# Patient Record
Sex: Male | Born: 2000 | Race: White | Hispanic: No | Marital: Single | Smoking: Never smoker
Health system: Southern US, Community
[De-identification: ages and names within clinical notes are randomized; demographics above are authoritative.]

---

## 2021-09-18 ENCOUNTER — Other Ambulatory Visit: Payer: Self-pay | Admitting: Sports Medicine

## 2021-09-18 DIAGNOSIS — R52 Pain, unspecified: Secondary | ICD-10-CM

## 2021-09-28 ENCOUNTER — Other Ambulatory Visit: Payer: Self-pay

## 2021-09-29 ENCOUNTER — Other Ambulatory Visit: Payer: Self-pay

## 2021-09-29 ENCOUNTER — Ambulatory Visit
Admission: RE | Admit: 2021-09-29 | Discharge: 2021-09-29 | Disposition: A | Discharge status: Home / Self Care | Payer: BC Managed Care – PPO | Source: Ambulatory Visit | Attending: Sports Medicine | Admitting: Sports Medicine

## 2021-09-29 DIAGNOSIS — R52 Pain, unspecified: Secondary | ICD-10-CM

## 2021-09-29 MED ORDER — METHYLPREDNISOLONE ACETATE 40 MG/ML INJ SUSP (RADIOLOG
40.0000 mg | Freq: Once | INTRAMUSCULAR | Status: AC
  Administered 2021-09-29: 40 mg via INTRA_ARTICULAR

## 2021-09-29 MED ORDER — IOPAMIDOL (ISOVUE-M 200) INJECTION 41%
1.0000 mL | Freq: Once | INTRAMUSCULAR | Status: AC
  Administered 2021-09-29: 1 mL via INTRA_ARTICULAR

## 2021-10-16 NOTE — Therapy (Signed)
?OUTPATIENT PHYSICAL THERAPY LOWER EXTREMITY EVALUATION ? ? ?Patient Name: Derrick Ellis ?MRN: [id number] ?DOB:[birth year], 21 y.o., male ?Today's Date: 10/17/2021 ? ? PT End of Session - 10/17/21 1340   ? ? Visit Number 1   ? Date for PT Re-Evaluation 11/14/21   ? Authorization Type BCBS   ? Authorization - Visit Number 1   ? Authorization - Number of Visits 30   ? PT Start Time 1102   ? PT Stop Time 1147   ? PT Time Calculation (min) 45 min   ? Activity Tolerance Patient tolerated treatment well   ? Behavior During Therapy [facility] for tasks assessed/performed   ? ?  ?  ? ?  ? ? ?PMH/PSH: 2020 Rt ACL tear with patellar tendon graft reconstuction ? ? ?PCP: Cristine Polio, MD ? ?REFERRING PROVIDER: Berle Mull, MD ? 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 ?THERAPY DIAG:  ?Cramp and spasm ? ?Abnormal posture ? ?ONSET DATE: fall football season 2022 ? ?SUBJECTIVE:  ? ?SUBJECTIVE STATEMENT: ?Pt with ongoing pelvic pain since football season fall 2022.  Pain was significant during the season and was more in the Rt proximal groin.  Pt is a Programmer, applications for [employer].  He is currently in "off season" practicing 3x/week, weight lifting 3x weekly.  This ends in 1 week.  Pt is fully participating but with pain.  He had pubic symphysis injection 09/29/21 which helped for about 1 week but pain is returning as of this week.  Current pain is Rt sided in posterior groin. ? ?PERTINENT HISTORY: ?Pubic symphysis injection under fluoroscopy 09/29/21 ?Rt ACL reconstruction 04/02/2019, BPTB Autograft  ? ?PAIN:  ?PAIN:  ?Are you having pain? Yes ?NPRS scale: 0-7/10 ?Pain location: Rt posterior groin ?Pain orientation: Right, Posterior, and Proximal  ?PAIN TYPE: sharp ?Pain description: sharp  ?Aggravating factors: when hips are uneven, certain positions, hard to describe ?Relieving factors: change of position ? ? ?PRECAUTIONS: None ? ?WEIGHT BEARING RESTRICTIONS No ? ?FALLS:  ?Has patient fallen in last  6 months? No ? ?LIVING ENVIRONMENT: ?Lives with: dorm room, single ?Lives in: Other dorm room at college ?Stairs: sometimes pain with stairs ?Has following equipment at home: None ? ?OCCUPATION: student, full time ? ?PLOF: Independent ? ?PATIENT GOALS  ?Get rid of pain ? ? ?OBJECTIVE:  ? ?DIAGNOSTIC FINDINGS:  ?Pelvic MRI 09/11/21 ?Pubic symphysis injection under flouroscopy 09/29/21 ? ?PATIENT SURVEYS:  ?FOTO 78%, goal 89% ? ?COGNITION: ? Overall cognitive status: Within functional limits for tasks assessed   ?  ?SENSATION: ?WFL ? ?MUSCLE LENGTH: ?End range hamstrings limited ?Rt > Lt piriformis and gluteals limited ? ?POSTURE:  ?scoliosis ? ?PALPATION: ?Signif local tender to palpation inside Rt ischial tuberosity, pelvic floor ?TP present in Rt glut min, glut med, piriformis ? ?LE ROM: ? ?Active ROM Right ?10/17/2021 Left ?10/17/2021  ?Hip flexion    ?Hip extension    ?Hip abduction    ?Hip adduction    ?Hip internal rotation    ?Hip external rotation 50 55  ?Knee flexion    ?Knee extension    ?Ankle dorsiflexion    ?Ankle plantarflexion    ?Ankle inversion    ?Ankle eversion    ? (Blank rows = not tested) ? ?LE MMT: ? ?5/5 with with recreation of sharp pelvic pain on testing Rt hip abduction ? ?LOWER EXTREMITY SPECIAL TESTS:  ?Hip special tests: Saralyn Pilar (FABER) test: negative ?SI joints unremarkable ? ?FUNCTIONAL TESTS:  ?External palpation of pelvic  floor lift and bulge: min lift, lacks bulge .  Pt denies difficulty with defecation or pain with this. ?Active SLR: lacks pelvic control with Lt LE lift ? ? ?GAIT: ?WNL ? ? ? ?TODAY'S TREATMENT: ?Pt education on pelvic floor anatomy using model, role of pelvic floor ?Discussion of findings from evaluation and possible treatment avenues (Pelvic floor internal release and retraining, DN to external hips, rebalance pelvic strength ?Therex: quadrupred rocking, diaphragmatic breathing, pigeon pose edge of mat table Rt LE ? ? ?PATIENT EDUCATION:  ?Education details: Access  Code: [email or web address] ?Person educated: Patient and Proofreader from college ?Education method: Explanation, Demonstration, and Handouts ?Education comprehension: verbalized understanding and returned demonstration ? ? ?HOME EXERCISE PROGRAM: ?Access Code: [email or web address] ?URL: [email or web address] ?Date: 10/17/2021 ?Prepared by: Venetia Night Dorse Locy ? ?Exercises ?- Pigeon Pose  - 1 x daily - 7 x weekly - 1 sets - 3 reps - 30 hold ?- Quadruped Rocking Backward  - 1 x daily - 7 x weekly - 1 sets - 15 reps ? ?ASSESSMENT: ? ?CLINICAL IMPRESSION: ?Patient is a 21 y.o. male who was seen today for physical therapy evaluation and treatment for pelvic pain.  He is a Psychologist, educational for [employer].  His athletic trainer from college attended session with him. He has history of a significant Rt proximal groin injury during fall 2022 football season.  This has since resolved but he now has posterior Rt sided pelvic pain.  He has significant local tenderness to palpation along inside of Rt ischial tuberosity, consistent with external palpation along pelvic floor muscles.  External palpation reveals fair PF lift and poor bulge.  He reports pain is sharp with certain movements or positions and he can often reposition to eliminate the pain.  He has 5/5 strength in bil LE although pain was reproduced with Rt hip abd strength testing.  SI joints are unremarkable.  Active SLR is + on Lt for poor pelvic control.  Hip ROM is limited bil ER and LE flexibility is limited end range for bil hamstrings, piriformis and gluteals.  Pt will benefit from further skilled assessment and treatment by a pelvic PT to address suspected dysfunction in Rt pelvic floor musculature and to rebalance stabilization of pelvis. ? ? ?OBJECTIVE IMPAIRMENTS decreased ROM, increased muscle spasms, impaired flexibility, impaired tone, and pain.  ? ?ACTIVITY LIMITATIONS  sport .  ? ?PERSONAL FACTORS Time since onset of injury/illness/exacerbation are  also affecting patient's functional outcome.  ? ? ?REHAB POTENTIAL: Excellent ? ?CLINICAL DECISION MAKING: Stable/uncomplicated ? ?EVALUATION COMPLEXITY: Low ? ? ?GOALS: ?Goals reviewed with patient? Yes ? ?SHORT TERM GOALS: Target date: 10/31/2021 ? ?Pt will be able to understand source of pelvic pain and learn tools to address dysfunction. ?Baseline: ?Goal status: INITIAL ? ?2.   ?Baseline:  ?Goal status:  ? ? ? ?LONG TERM GOALS: Target date: 11/14/2021 ? ?Pt will report improved pelvic pain by at least 50% with exercise. ?Baseline:  ?Goal status: INITIAL ? ?2.  Pt will be ind with HEP and understand how to safely progress. ?Baseline:  ?Goal status: INITIAL ? ?3.  Improve FOTO score to at least 89% to demo improved function. ?Baseline: 78% ?Goal status: INITIAL ? ?4. ? ? ?PLAN: ?PT FREQUENCY: 2x/week ? ?PT DURATION: 4 weeks ? ?PLANNED INTERVENTIONS: Therapeutic exercises, Therapeutic activity, Neuromuscular re-education, Balance training, Patient/Family education, Joint mobilization, Dry Needling, Electrical stimulation, Spinal mobilization, Cryotherapy, Moist heat, Biofeedback, and Manual therapy ? ?PLAN FOR NEXT SESSION: with pelvic PT: assess and treat Rt pelvic  floor pain if Pt gives consent, add new goals if appropriate ? ? ?Baruch Merl, PT ?10/17/21 1:42 PM ? ?

## 2021-10-17 ENCOUNTER — Encounter: Payer: Self-pay | Admitting: Physical Therapy

## 2021-10-17 ENCOUNTER — Ambulatory Visit: Payer: BC Managed Care – PPO | Attending: Sports Medicine | Admitting: Physical Therapy

## 2021-10-17 DIAGNOSIS — R293 Abnormal posture: Secondary | ICD-10-CM | POA: Diagnosis not present

## 2021-10-17 DIAGNOSIS — R252 Cramp and spasm: Secondary | ICD-10-CM | POA: Diagnosis present

## 2021-10-24 ENCOUNTER — Encounter: Payer: PRIVATE HEALTH INSURANCE | Admitting: Physical Therapy

## 2021-10-24 ENCOUNTER — Ambulatory Visit: Payer: BC Managed Care – PPO

## 2021-10-24 DIAGNOSIS — R293 Abnormal posture: Secondary | ICD-10-CM

## 2021-10-24 DIAGNOSIS — R252 Cramp and spasm: Secondary | ICD-10-CM | POA: Diagnosis not present

## 2021-10-24 NOTE — Therapy (Signed)
?OUTPATIENT PHYSICAL THERAPY TREATMENT NOTE ? ? ?Patient Name: Derrick Ellis ?MRN: [id number] ?DOB:2000-08-30, 21 y.o., male ?Today's Date: 10/24/2021 ? ?PCP: Marcelle Smiling, MD ?REFERRING PROVIDER: Marcelle Smiling, MD ? ?END OF SESSION:  ? PT End of Session - 10/24/21 0935   ? ? Visit Number 2   ? Date for PT Re-Evaluation 11/14/21   ? Authorization Type BCBS   ? Authorization - Visit Number 2   ? Authorization - Number of Visits 30   ? PT Start Time 0935   ? PT Stop Time 1015   ? PT Time Calculation (min) 40 min   ? Activity Tolerance Patient tolerated treatment well   ? Behavior During Therapy [facility] for tasks assessed/performed   ? ?  ?  ? ?  ? ? ?History reviewed. No pertinent past medical history. ?History reviewed. No pertinent surgical history. ?There are no problems to display for this patient. ? ? 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 ?THERAPY DIAG:  ?Cramp and spasm ? ?Abnormal posture ? ?PERTINENT HISTORY: Rt ACL repair; Rt groin strain ? ?PRECAUTIONS: NA ? ?SUBJECTIVE: Pt states that pain was not aggravated after last treatment session. He is able to practice, but he is very sore. He also reports previous groin injury that was separate from this new pain that started in September. He denies any bowel, urinary, sexual dysfunction. He was having Rt sided abdominal pain with sit-ups, but this is no longer happening. He feels like pain is worse when hips are uneven.  ? ?PAIN:  ?Are you having pain? Yes, 4/10, moderate, but can be sharp ? ?  ?SUBJECTIVE:  ?  ?SUBJECTIVE STATEMENT: ?Pt with ongoing pelvic pain since football season fall 2022.  Pain was significant during the season and was more in the Rt proximal groin.  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  This ends in 1 week.  Pt is fully participating but with pain.  He had pubic symphysis injection 09/29/21 which helped for about 1 week but pain is  returning as of this week.  Current pain is Rt sided in posterior groin. ?  ?PERTINENT HISTORY: ?Pubic symphysis injection under fluoroscopy 09/29/21 ?Rt ACL reconstruction 04/02/2019, BPTB Autograft  ?  ?PAIN:  ?PAIN:  ?Are you having pain? Yes ?NPRS scale: 0-7/10 ?Pain location: Rt posterior groin ?Pain orientation: Right, Posterior, and Proximal  ?PAIN TYPE: sharp ?Pain description: sharp  ?Aggravating factors: when hips are uneven, certain positions, hard to describe ?Relieving factors: change of position ?  ?  ?PRECAUTIONS: None ?  ?WEIGHT BEARING RESTRICTIONS No ?  ?FALLS:  ?Has patient fallen in last 6 months? No ?  ?LIVING ENVIRONMENT: ?Lives with: dorm room, single ?Lives in: Other dorm room at college ?Stairs: sometimes pain with stairs ?Has following equipment at home: None ?  ?OCCUPATION: student, full time ?  ?PLOF: Independent ?  ?PATIENT GOALS  ?Get rid of pain ?  ?  ?OBJECTIVE: Reduced and painful IR on Rt hip, no pain with resisted Rt adduction, tenderness externally to Rt OI; decreased stability with SL squat on Rt and Rt weight shift/Lt rotation with regular squat; significant tightness and tenderness throughout Rt glutes, hip flexors, and adductors; internal rectal exam demonstrated exquisite pain in Rt puborectalis and obturator internus ?  ?Objective 10/17/21: ? ?DIAGNOSTIC FINDINGS:  ?Pelvic MRI 09/11/21 ?Pubic symphysis injection under flouroscopy 09/29/21 ?  ?PATIENT SURVEYS:  ?FOTO 78%,  goal 89% ?  ?COGNITION: ?          Overall cognitive status: Within functional limits for tasks assessed               ?           ?SENSATION: ?WFL ?  ?MUSCLE LENGTH: ?End range hamstrings limited ?Rt > Lt piriformis and gluteals limited ?  ?POSTURE:  ?scoliosis ?  ?PALPATION: ?Signif local tender to palpation inside Rt ischial tuberosity, pelvic floor ?TP present in Rt glut min, glut med, piriformis ?  ?LE ROM: ?  ?Active ROM Right ?10/17/2021 Left ?10/17/2021  ?Hip flexion      ?Hip extension      ?Hip abduction       ?Hip adduction      ?Hip internal rotation      ?Hip external rotation 50 55  ?Knee flexion      ?Knee extension      ?Ankle dorsiflexion      ?Ankle plantarflexion      ?Ankle inversion      ?Ankle eversion      ? (Blank rows = not tested) ?  ?LE MMT: ?  ?5/5 with with recreation of sharp pelvic pain on testing Rt hip abduction ?  ?LOWER EXTREMITY SPECIAL TESTS:  ?Hip special tests: [name] (FABER) test: negative ?SI joints unremarkable ?  ?FUNCTIONAL TESTS:  ?External palpation of pelvic floor lift and bulge: min lift, lacks bulge .  Pt denies difficulty with defecation or pain with this. ?Active SLR: lacks pelvic control with Lt LE lift ?  ?  ?GAIT: ?WNL ?  ?  ?  ?TODAY'S TREATMENT 10/24/21: ?Manual: ?Soft tissue mobilization: ?Scar tissue mobilization: ?Myofascial release: ?Spinal mobilization: ?Internal pelvic floor techniques: ?Rectal exam ?Internal obturator internus release ?External obturator internus release ?Dry needling: ?Rt obturator internus ?Neuromuscular re-education: ?Core retraining:  ?Core facilitation: ?Form correction: ?Pelvic floor contraction training: ?Down training: ?Exercises: ?Stretches/mobility: ?Obturator internus stretch/mobility in supine hooklying with hip hiking 6 min ?Bent figure 4 stretching 60 sec bil ?Pigeon pose 60 esc bil ?Strengthening: ?Therapeutic activities: ?Functional strengthening activities: ?Self-care: ? ?No emotional/communication barriers or cognitive limitation. Patient is motivated to learn. Patient understands and agrees with treatment goals and plan. PT explains patient will be examined in standing, sitting, and lying down to see how their muscles and joints work. When they are ready, they will be asked to remove their underwear so PT can examine their perineum. The patient is also given the option of providing their own chaperone as one is not provided in our facility. The patient also has the right and is explained the right to defer or refuse any part of the  evaluation or treatment including the internal exam. With the patient's consent, PT will use one gloved finger to gently assess the muscles of the pelvic floor, seeing how well it contracts and relaxes and if there is muscle symmetry. After, the patient will get dressed and PT and patient will discuss exam findings and plan of care. PT and patient discuss plan of care, schedule, attendance policy and HEP activities. ? ? ?TREATMENT 10/17/21: ?Pt education on pelvic floor anatomy using model, role of pelvic floor ?Discussion of findings from evaluation and possible treatment avenues (Pelvic floor internal release and retraining, DN to external hips, rebalance pelvic strength ?Therex: quadrupred rocking, diaphragmatic breathing, pigeon pose edge of mat table Rt LE ?  ?  ?PATIENT EDUCATION:  ?Education details: Pt education on pelvic floor anatomy and relationship  between knee surgery, adductor strain, and current pain. Access Code: [email or web address] ?Person educated: Patient and English as a second language teacher from college ?Education method: Explanation, Demonstration, and Handouts ?Education comprehension: verbalized understanding and returned demonstration ?  ?  ?HOME EXERCISE PROGRAM: ?Access Code: [email or web address] ?URL: [email or web address] ?Date: 10/17/2021 ?Prepared by: Loistine Simas Beuhring ?  ?Exercises ?- Pigeon Pose  - 1 x daily - 7 x weekly - 1 sets - 3 reps - 30 hold ?- Quadruped Rocking Backward  - 1 x daily - 7 x weekly - 1 sets - 15 reps ?  ?ASSESSMENT: ?  ?CLINICAL IMPRESSION: ?Pt returned to physical therapy for pelvic floor assessment today. Notable findings consisted of pelvic instability on Rt with single leg squat/regular squat, significant tightness surrounding Rt hip/lower abdomen/proximal thigh, tenderness to palpation over Rt ischial tuberosity and obturator internus, significant internal pelvic floor tightness and palpable obturator internus trigger points, decrease Rt hip IR with pain, and clenching behavior.  He tolerated internal release and DN to help reduce trigger points/restriction poorly due to large amount of pain. Better tolerance to external release of obturator internal. Pt reported decrease in pain from

## 2021-10-26 ENCOUNTER — Ambulatory Visit: Payer: BC Managed Care – PPO

## 2021-10-26 DIAGNOSIS — R293 Abnormal posture: Secondary | ICD-10-CM

## 2021-10-26 DIAGNOSIS — R252 Cramp and spasm: Secondary | ICD-10-CM | POA: Diagnosis not present

## 2021-10-26 NOTE — Therapy (Signed)
?OUTPATIENT PHYSICAL THERAPY TREATMENT NOTE ? ? ?Patient Name: Derrick Ellis ?MRN: [id number] ?DOB:2000-10-25, 21 y.o., male ?Today's Date: 10/26/2021 ? ?PCP: Derrick Smiling, MD ?REFERRING PROVIDER: Pati Gallo, MD ? ?END OF SESSION:  ? PT End of Session - 10/26/21 1016   ? ? Visit Number 3   ? Date for PT Re-Evaluation 11/14/21   ? Authorization Type BCBS   ? Authorization - Visit Number 3   ? Authorization - Number of Visits 30   ? PT Start Time 1016   ? PT Stop Time 1055   ? PT Time Calculation (min) 39 min   ? Activity Tolerance Patient tolerated treatment well   ? Behavior During Therapy [name] for tasks assessed/performed   ? ?  ?  ? ?  ? ? ?History reviewed. No pertinent past medical history. ?History reviewed. No pertinent surgical history. ?There are no problems to display for this patient. ? ? 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 ?THERAPY DIAG:  ?Cramp and spasm ? ?Abnormal posture ? ?PERTINENT HISTORY: Rt ACL repair; Rt groin strain ? ?PRECAUTIONS: NA ? ?SUBJECTIVE: Pt states that he feels better after last treatment session. He did have practice last night and reports pain, but not until the very end of practice. He likes folded figure 4 stretch and has incorporated at the end of his stretch routine.  ? ?PAIN:  ?Are you having pain? Yes, 4/10, moderate, but can be sharp ? ?  ?SUBJECTIVE:  ?  ?SUBJECTIVE STATEMENT: ?Pt with ongoing pelvic pain since football season fall 2022.  Pain was significant during the season and was more in the Rt proximal groin.  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  This ends in 1 week.  Pt is fully participating but with pain.  He had pubic symphysis injection 09/29/21 which helped for about 1 week but pain is returning as of this week.  Current pain is Rt sided in posterior groin. ?  ?PERTINENT HISTORY: ?Pubic symphysis injection under fluoroscopy 09/29/21 ?Rt ACL  reconstruction 04/02/2019, BPTB Autograft  ?  ?PAIN:  ?PAIN:  ?Are you having pain? Yes ?NPRS scale: 0-7/10 ?Pain location: Rt posterior groin ?Pain orientation: Right, Posterior, and Proximal  ?PAIN TYPE: sharp ?Pain description: sharp  ?Aggravating factors: when hips are uneven, certain positions, hard to describe ?Relieving factors: change of position ?  ?  ?PRECAUTIONS: None ?  ?WEIGHT BEARING RESTRICTIONS No ?  ?FALLS:  ?Has patient fallen in last 6 months? No ?  ?LIVING ENVIRONMENT: ?Lives with: dorm room, single ?Lives in: Other dorm room at college ?Stairs: sometimes pain with stairs ?Has following equipment at home: None ?  ?OCCUPATION: student, full time ?  ?PLOF: Independent ?  ?PATIENT GOALS  ?Get rid of pain ?  ?  ?OBJECTIVE 10/24/21: Reduced and painful IR on Rt hip, no pain with resisted Rt adduction, tenderness externally to Rt OI; decreased stability with SL squat on Rt and Rt weight shift/Lt rotation with regular squat; significant tightness and tenderness throughout Rt glutes, hip flexors, and adductors; internal rectal exam demonstrated exquisite pain in Rt puborectalis and obturator internus ?  ?Objective 10/17/21: ? ?DIAGNOSTIC FINDINGS:  ?Pelvic MRI 09/11/21 ?Pubic symphysis injection under flouroscopy 09/29/21 ?  ?PATIENT SURVEYS:  ?FOTO 78%, goal 89% ?  ?COGNITION: ?          Overall cognitive status: Within functional limits for tasks assessed               ?           ?  SENSATION: ?WFL ?  ?MUSCLE LENGTH: ?End range hamstrings limited ?Rt > Lt piriformis and gluteals limited ?  ?POSTURE:  ?scoliosis ?  ?PALPATION: ?Signif local tender to palpation inside Rt ischial tuberosity, pelvic floor ?TP present in Rt glut min, glut med, piriformis ?  ?LE ROM: ?  ?Active ROM Right ?10/17/2021 Left ?10/17/2021  ?Hip flexion      ?Hip extension      ?Hip abduction      ?Hip adduction      ?Hip internal rotation      ?Hip external rotation 50 55  ?Knee flexion      ?Knee extension      ?Ankle dorsiflexion       ?Ankle plantarflexion      ?Ankle inversion      ?Ankle eversion      ? (Blank rows = not tested) ?  ?LE MMT: ?  ?5/5 with with recreation of sharp pelvic pain on testing Rt hip abduction ?  ?LOWER EXTREMITY SPECIAL TESTS:  ?Hip special tests: [name] (FABER) test: negative ?SI joints unremarkable ?  ?FUNCTIONAL TESTS:  ?External palpation of pelvic floor lift and bulge: min lift, lacks bulge .  Pt denies difficulty with defecation or pain with this. ?Active SLR: lacks pelvic control with Lt LE lift ?  ?  ?GAIT: ?WNL ?  ?  ?  ?TODAY'S TREATMENT: ?Manual: ?Soft tissue mobilization: ?External obturator internus release ?Release to Rt glutes/pirifomris/hip rotators ?Release to Rt adductors/hip flexors ?Scar tissue mobilization: ?Myofascial release: ?Instrument assisted release to adductors and lumbar paraspinals on Rt ?Spinal mobilization: ?Internal pelvic floor techniques: ?Dry needling: ?Neuromuscular re-education: ?Core retraining:  ?Core facilitation: ?Form correction: ?Pelvic floor contraction training: ?Down training: ?Exercises: ?Stretches/mobility: ?Child's pose 4 min ?Child's pose with internal rotation 4 min ?Strengthening: ?Therapeutic activities: ?Functional strengthening activities: ?Self-care: ? ? ? ?TREATMENT 10/24/21: ?Manual: ?Soft tissue mobilization: ?Scar tissue mobilization: ?Myofascial release: ?Spinal mobilization: ?Internal pelvic floor techniques: ?Rectal exam ?Internal obturator internus release ?External obturator internus release ?Dry needling: ?Rt obturator internus ?Neuromuscular re-education: ?Core retraining:  ?Core facilitation: ?Form correction: ?Pelvic floor contraction training: ?Down training: ?Exercises: ?Stretches/mobility: ?Obturator internus stretch/mobility in supine hooklying with hip hiking 6 min ?Bent figure 4 stretching 60 sec bil ?Pigeon pose 60 esc bil ?Strengthening: ?Therapeutic activities: ?Functional strengthening activities: ?Self-care: ? ?No emotional/communication  barriers or cognitive limitation. Patient is motivated to learn. Patient understands and agrees with treatment goals and plan. PT explains patient will be examined in standing, sitting, and lying down to see how their muscles and joints work. When they are ready, they will be asked to remove their underwear so PT can examine their perineum. The patient is also given the option of providing their own chaperone as one is not provided in our facility. The patient also has the right and is explained the right to defer or refuse any part of the evaluation or treatment including the internal exam. With the patient's consent, PT will use one gloved finger to gently assess the muscles of the pelvic floor, seeing how well it contracts and relaxes and if there is muscle symmetry. After, the patient will get dressed and PT and patient will discuss exam findings and plan of care. PT and patient discuss plan of care, schedule, attendance policy and HEP activities. ? ? ?TREATMENT 10/17/21: ?Pt education on pelvic floor anatomy using model, role of pelvic floor ?Discussion of findings from evaluation and possible treatment avenues (Pelvic floor internal release and retraining, DN to external hips, rebalance pelvic  strength ?Therex: quadrupred rocking, diaphragmatic breathing, pigeon pose edge of mat table Rt LE ?  ?  ?PATIENT EDUCATION:  ?Education details: Access Code: [email or web address] ?Person educated: Patient and English as a second language teacher from college ?Education method: Explanation, Demonstration, and Handouts ?Education comprehension: verbalized understanding and returned demonstration ?  ?  ?HOME EXERCISE PROGRAM: ?Access Code: [email or web address] ?URL: [email or web address] ?Date: 10/17/2021 ?Prepared by: Loistine Simas Beuhring ?  ?Exercises ?- Pigeon Pose  - 1 x daily - 7 x weekly - 1 sets - 3 reps - 30 hold ?- Quadruped Rocking Backward  - 1 x daily - 7 x weekly - 1 sets - 15 reps ?  ?ASSESSMENT: ?  ?CLINICAL IMPRESSION: ?Focus of  treatment placed on manual techniques to help reduce muscular restriction of Rt obturator internus externally and surrounding pelvic hip muscles. He did very well with release today and demonstrated overall less tension. Afte

## 2021-11-02 ENCOUNTER — Ambulatory Visit: Payer: BC Managed Care – PPO | Admitting: Physical Therapy

## 2021-11-02 ENCOUNTER — Encounter: Payer: Self-pay | Admitting: Physical Therapy

## 2021-11-02 DIAGNOSIS — R252 Cramp and spasm: Secondary | ICD-10-CM | POA: Diagnosis not present

## 2021-11-02 DIAGNOSIS — R293 Abnormal posture: Secondary | ICD-10-CM

## 2021-11-02 NOTE — Therapy (Signed)
?OUTPATIENT PHYSICAL THERAPY TREATMENT NOTE ? ? ?Patient Name: Derrick Ellis ?MRN: [id number] ?DOB:12-18-2000, 21 y.o., male ?Today's Date: 11/02/2021 ? ?PCP: Cristine Polio, MD ?REFERRING PROVIDER: Berle Mull, MD ? ?END OF SESSION:  ? PT End of Session - 11/02/21 0936   ? ? Visit Number 4   ? Date for PT Re-Evaluation 11/14/21   ? Authorization Type BCBS   ? Authorization - Visit Number 4   ? Authorization - Number of Visits 30   ? PT Start Time [phone]   ? PT Stop Time 1015   ? PT Time Calculation (min) 42 min   ? Activity Tolerance Patient tolerated treatment well   ? Behavior During Therapy [facility] for tasks assessed/performed   ? ?  ?  ? ?  ? ? ?History reviewed. No pertinent past medical history. ?History reviewed. No pertinent surgical history. ?There are no problems to display for this patient. ? ? 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 ?THERAPY DIAG:  ?Cramp and spasm ? ?Abnormal posture ? ?PERTINENT HISTORY: Rt ACL repair; Rt groin strain ? ?PRECAUTIONS: NA ? ?SUBJECTIVE:  ? ?PAIN:  ?PAIN:  ?Are you having pain? No ?NPRS scale: 0/10 ?Pain location: Rt buttock/pelvic region  ?Pain orientation: Right  ?PAIN TYPE: tight ?Pain description:  tight   ?Aggravating factors: when hips uneven ?Relieving factors: stretching  ? ?  ?SUBJECTIVE:  ?  ?SUBJECTIVE STATEMENT: ?The stretches really help but in the AM when I wake up I'm back to baseline tightness. ?  ?PERTINENT HISTORY: ?Pubic symphysis injection under fluoroscopy 09/29/21 ?Rt ACL reconstruction 04/02/2019, BPTB Autograft  ?  ?  ?PRECAUTIONS: None ?  ?WEIGHT BEARING RESTRICTIONS No ?  ?FALLS:  ?Has patient fallen in last 6 months? No ?  ?LIVING ENVIRONMENT: ?Lives with: dorm room, single ?Lives in: Other dorm room at college ?Stairs: sometimes pain with stairs ?Has following equipment at home: None ?  ?OCCUPATION: student, full time ?  ?PLOF: Independent ?  ?PATIENT GOALS  ?Get rid of pain ?  ?  ?OBJECTIVE 10/24/21: Reduced and painful IR on Rt hip,  no pain with resisted Rt adduction, tenderness externally to Rt OI; decreased stability with SL squat on Rt and Rt weight shift/Lt rotation with regular squat; significant tightness and tenderness throughout Rt glutes, hip flexors, and adductors; internal rectal exam demonstrated exquisite pain in Rt puborectalis and obturator internus ?  ?Objective 10/17/21: ? ?DIAGNOSTIC FINDINGS:  ?Pelvic MRI 09/11/21 ?Pubic symphysis injection under flouroscopy 09/29/21 ?  ?PATIENT SURVEYS:  ?FOTO 78%, goal 89% ?  ?COGNITION: ?          Overall cognitive status: Within functional limits for tasks assessed               ?           ?SENSATION: ?WFL ?  ?MUSCLE LENGTH: ?End range hamstrings limited ?Rt > Lt piriformis and gluteals limited ?  ?POSTURE:  ?scoliosis ?  ?PALPATION: ?Signif local tender to palpation inside Rt ischial tuberosity, pelvic floor ?TP present in Rt glut min, glut med, piriformis ?  ?LE ROM: ?  ?Active ROM Right ?10/17/2021 Left ?10/17/2021  ?Hip flexion      ?Hip extension      ?Hip abduction      ?Hip adduction      ?Hip internal rotation      ?Hip external rotation 50 55  ?Knee flexion      ?Knee extension      ?Ankle dorsiflexion      ?  Ankle plantarflexion      ?Ankle inversion      ?Ankle eversion      ? (Blank rows = not tested) ?  ?LE MMT: ?  ?5/5 with with recreation of sharp pelvic pain on testing Rt hip abduction ?  ?LOWER EXTREMITY SPECIAL TESTS:  ?Hip special tests: Saralyn Pilar (FABER) test: negative ?SI joints unremarkable ?  ?FUNCTIONAL TESTS:  ?External palpation of pelvic floor lift and bulge: min lift, lacks bulge .  Pt denies difficulty with defecation or pain with this. ?Active SLR: lacks pelvic control with Lt LE lift ?  ?  ?GAIT: ?WNL ?  ?  ?  ?TODAY'S TREATMENT: ? ?11/02/21: ?Dynamic hip warm up along 20' 2 laps each: ?High knee to heel raise fwd walk ?Hip opener and closer walk ?Fig 4 walk ?Standing T walk ?Monster walk fwd/bwd and sidestepping red loop band ?Lunge walk fwd ? ?  Straddle adductor  stretch standing bil 2x20" in hip flexion elbows on low mat table ?  Dynamic frog stretch standing x 10 reps using elbows inside knees for overpressure ?  Supine internal rotation Rt hip stretch x 1' to allow time for release ?  Seated on mat table fig 4 stretch x 1' to allow time for release ?  SLS on Rt standing on double blue balance pads 60 mini Lt hip flex/ext and abd/add for peterbation x 60 each ?  SLS on Rt LE 3-way clock taps x 10 rounds ?  Lateral bounding off rounded BOSU Lt foot to Rt LE landing with mirror for feedback x 10 reps ? ?STM: external approach trigger point release glut min, med and piriformis in Lt SL ? ?Manual: ?Soft tissue mobilization: ?External obturator internus release ?Release to Rt glutes/pirifomris/hip rotators ?Release to Rt adductors/hip flexors ?Scar tissue mobilization: ?Myofascial release: ?Instrument assisted release to adductors and lumbar paraspinals on Rt ?Spinal mobilization: ?Internal pelvic floor techniques: ?Dry needling: ?Neuromuscular re-education: ?Core retraining:  ?Core facilitation: ?Form correction: ?Pelvic floor contraction training: ?Down training: ?Exercises: ?Stretches/mobility: ?Child's pose 4 min ?Child's pose with internal rotation 4 min ?Strengthening: ?Therapeutic activities: ?Functional strengthening activities: ?Self-care: ? ? ? ?TREATMENT 10/24/21: ?Manual: ?Soft tissue mobilization: ?Scar tissue mobilization: ?Myofascial release: ?Spinal mobilization: ?Internal pelvic floor techniques: ?Rectal exam ?Internal obturator internus release ?External obturator internus release ?Dry needling: ?Rt obturator internus ?Neuromuscular re-education: ?Core retraining:  ?Core facilitation: ?Form correction: ?Pelvic floor contraction training: ?Down training: ?Exercises: ?Stretches/mobility: ?Obturator internus stretch/mobility in supine hooklying with hip hiking 6 min ?Bent figure 4 stretching 60 sec bil ?Pigeon pose 60 esc bil ?Strengthening: ?Therapeutic  activities: ?Functional strengthening activities: ?Self-care: ? ?No emotional/communication barriers or cognitive limitation. Patient is motivated to learn. Patient understands and agrees with treatment goals and plan. PT explains patient will be examined in standing, sitting, and lying down to see how their muscles and joints work. When they are ready, they will be asked to remove their underwear so PT can examine their perineum. The patient is also given the option of providing their own chaperone as one is not provided in our facility. The patient also has the right and is explained the right to defer or refuse any part of the evaluation or treatment including the internal exam. With the patient's consent, PT will use one gloved finger to gently assess the muscles of the pelvic floor, seeing how well it contracts and relaxes and if there is muscle symmetry. After, the patient will get dressed and PT and patient will discuss exam findings and  plan of care. PT and patient discuss plan of care, schedule, attendance policy and HEP activities. ? ? ?TREATMENT 10/17/21: ?Pt education on pelvic floor anatomy using model, role of pelvic floor ?Discussion of findings from evaluation and possible treatment avenues (Pelvic floor internal release and retraining, DN to external hips, rebalance pelvic strength ?Therex: quadrupred rocking, diaphragmatic breathing, pigeon pose edge of mat table Rt LE ?  ?  ?PATIENT EDUCATION:  ?Education details: Access Code: [email or web address] ?Person educated: Patient and Proofreader from college ?Education method: Explanation, Demonstration, and Handouts ?Education comprehension: verbalized understanding and returned demonstration ?  ?  ?HOME EXERCISE PROGRAM: ?Access Code: [email or web address] ?URL: [email or web address] ?Date: 10/17/2021 ?Prepared by: Venetia Night Nafisa Olds ?  ?Exercises ?- Pigeon Pose  - 1 x daily - 7 x weekly - 1 sets - 3 reps - 30 hold ?- Quadruped Rocking Backward  - 1 x daily  - 7 x weekly - 1 sets - 15 reps ?  ?ASSESSMENT: ?  ?CLINICAL IMPRESSION: ?Session focused on dynamic stretching and functional mobility and control of Rt LE, following by external TP release of Rt lateral hip.

## 2021-11-03 ENCOUNTER — Ambulatory Visit: Payer: BC Managed Care – PPO

## 2021-11-03 DIAGNOSIS — R293 Abnormal posture: Secondary | ICD-10-CM

## 2021-11-03 DIAGNOSIS — R252 Cramp and spasm: Secondary | ICD-10-CM

## 2021-11-03 NOTE — Therapy (Signed)
?OUTPATIENT PHYSICAL THERAPY TREATMENT NOTE ? ? ?Patient Name: Derrick Ellis ?MRN: [id number] ?DOB:03-Oct-2000, 21 y.o., male ?Today's Date: 11/03/2021 ? ?PCP: Marcelle Smiling, MD ?REFERRING PROVIDER: Pati Gallo, MD ? ?END OF SESSION:  ? PT End of Session - 11/03/21 0935   ? ? Visit Number 5   ? Date for PT Re-Evaluation 11/14/21   ? Authorization Type BCBS   ? Authorization - Visit Number 5   ? Authorization - Number of Visits 30   ? PT Start Time [phone]   ? PT Stop Time 1011   ? PT Time Calculation (min) 38 min   ? Activity Tolerance Patient tolerated treatment well   ? Behavior During Therapy [facility] for tasks assessed/performed   ? ?  ?  ? ?  ? ? ?History reviewed. No pertinent past medical history. ?History reviewed. No pertinent surgical history. ?There are no problems to display for this patient. ? ? 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 ?THERAPY DIAG:  ?Cramp and spasm ? ?Abnormal posture ? ?PERTINENT HISTORY: Rt ACL repair; Rt groin strain ? ?PRECAUTIONS: NA ? ?SUBJECTIVE: Pt states that he had to do max squats last night and felt significant tightness in Rt glutes. He reports that stretching is very helpful, but does not last long. He states that Rt groin/pelvic pain is much better, but he did feel some tightness at base of buttocks while descending into squat. The actual pelvic pain that brought him into PT is gone.  ? ?PAIN:  ?PAIN:  ?Are you having pain? No ?NPRS scale: 0/10 ?Pain location: Rt buttock/pelvic region  ?Pain orientation: Right  ?PAIN TYPE: tight ?Pain description:  tight   ?Aggravating factors: when hips uneven ?Relieving factors: stretching  ? ?  ?SUBJECTIVE:  ?  ?SUBJECTIVE STATEMENT: ?The stretches really help but in the AM when I wake up I'm back to baseline tightness. ?  ?PERTINENT HISTORY: ?Pubic symphysis injection under fluoroscopy 09/29/21 ?Rt ACL reconstruction 04/02/2019, BPTB Autograft  ?  ?  ?PRECAUTIONS: None ?  ?WEIGHT BEARING RESTRICTIONS No ?  ?FALLS:  ?Has  patient fallen in last 6 months? No ?  ?LIVING ENVIRONMENT: ?Lives with: dorm room, single ?Lives in: Other dorm room at college ?Stairs: sometimes pain with stairs ?Has following equipment at home: None ?  ?OCCUPATION: student, full time ?  ?PLOF: Independent ?  ?PATIENT GOALS  ?Get rid of pain ?  ?  ?OBJECTIVE 10/24/21: Reduced and painful IR on Rt hip, no pain with resisted Rt adduction, tenderness externally to Rt OI; decreased stability with SL squat on Rt and Rt weight shift/Lt rotation with regular squat; significant tightness and tenderness throughout Rt glutes, hip flexors, and adductors; internal rectal exam demonstrated exquisite pain in Rt puborectalis and obturator internus ?  ?Objective 10/17/21: ? ?DIAGNOSTIC FINDINGS:  ?Pelvic MRI 09/11/21 ?Pubic symphysis injection under flouroscopy 09/29/21 ?  ?PATIENT SURVEYS:  ?FOTO 78%, goal 89% ?  ?COGNITION: ?          Overall cognitive status: Within functional limits for tasks assessed               ?           ?SENSATION: ?WFL ?  ?MUSCLE LENGTH: ?End range hamstrings limited ?Rt > Lt piriformis and gluteals limited ?  ?POSTURE:  ?scoliosis ?  ?PALPATION: ?Signif local tender to palpation inside Rt ischial tuberosity, pelvic floor ?TP present in Rt glut min, glut med, piriformis ?  ?LE ROM: ?  ?Active ROM  Right ?10/17/2021 Left ?10/17/2021  ?Hip flexion      ?Hip extension      ?Hip abduction      ?Hip adduction      ?Hip internal rotation      ?Hip external rotation 50 55  ?Knee flexion      ?Knee extension      ?Ankle dorsiflexion      ?Ankle plantarflexion      ?Ankle inversion      ?Ankle eversion      ? (Blank rows = not tested) ?  ?LE MMT: ?  ?5/5 with with recreation of sharp pelvic pain on testing Rt hip abduction ?  ?LOWER EXTREMITY SPECIAL TESTS:  ?Hip special tests: Luisa Hart (FABER) test: negative ?SI joints unremarkable ?  ?FUNCTIONAL TESTS:  ?External palpation of pelvic floor lift and bulge: min lift, lacks bulge .  Pt denies difficulty with defecation  or pain with this. ?Active SLR: lacks pelvic control with Lt LE lift ?  ?  ?GAIT: ?WNL ?  ?  ?  ?TODAY'S TREATMENT 11/03/21: ?Manual: ?Soft tissue mobilization: ?Rt glutes/piriformis/TFL ?Scar tissue mobilization: ?Myofascial release: ?Instrument assisted soft tissue mobs to Rt proximal hamstring/adductors ?Spinal mobilization: ?Internal pelvic floor techniques: ?Dry needling: ?Rt glutes/piriformis/TFL ?Neuromuscular re-education: ?Core retraining:  ?Core facilitation: ?Form correction: ?Pelvic floor contraction training: ?Down training: ?Exercises: ?Stretches/mobility: ?Strengthening: ?Therapeutic activities: ?Functional strengthening activities: ?Self-care: ? ? ? ?TREATMENT: ? ?11/02/21: ?Dynamic hip warm up along 20' 2 laps each: ?High knee to heel raise fwd walk ?Hip opener and closer walk ?Fig 4 walk ?Standing T walk ?Monster walk fwd/bwd and sidestepping red loop band ?Lunge walk fwd ? ?  Straddle adductor stretch standing bil 2x20" in hip flexion elbows on low mat table ?  Dynamic frog stretch standing x 10 reps using elbows inside knees for overpressure ?  Supine internal rotation Rt hip stretch x 1' to allow time for release ?  Seated on mat table fig 4 stretch x 1' to allow time for release ?  SLS on Rt standing on double blue balance pads 60 mini Lt hip flex/ext and abd/add for peterbation x 60 each ?  SLS on Rt LE 3-way clock taps x 10 rounds ?  Lateral bounding off rounded BOSU Lt foot to Rt LE landing with mirror for feedback x 10 reps ? ?STM: external approach trigger point release glut min, med and piriformis in Lt SL ? ?Manual: ?Soft tissue mobilization: ?External obturator internus release ?Release to Rt glutes/pirifomris/hip rotators ?Release to Rt adductors/hip flexors ?Scar tissue mobilization: ?Myofascial release: ?Instrument assisted release to adductors and lumbar paraspinals on Rt ?Spinal mobilization: ?Internal pelvic floor techniques: ?Dry needling: ?Neuromuscular re-education: ?Core  retraining:  ?Core facilitation: ?Form correction: ?Pelvic floor contraction training: ?Down training: ?Exercises: ?Stretches/mobility: ?Child's pose 4 min ?Child's pose with internal rotation 4 min ?Strengthening: ?Therapeutic activities: ?Functional strengthening activities: ?Self-care: ? ? ? ?  ?PATIENT EDUCATION:  ?Education details: Access Code: [email or web address] ?Person educated: Patient and English as a second language teacher from college ?Education method: Explanation, Demonstration, and Handouts ?Education comprehension: verbalized understanding and returned demonstration ?  ?  ?HOME EXERCISE PROGRAM: ?Access Code: [email or web address] ?URL: [email or web address] ?Date: 10/17/2021 ?Prepared by: Loistine Simas Beuhring ?  ?Exercises ?- Pigeon Pose  - 1 x daily - 7 x weekly - 1 sets - 3 reps - 30 hold ?- Quadruped Rocking Backward  - 1 x daily - 7 x weekly - 1 sets - 15 reps ?  ?ASSESSMENT: ?  ?CLINICAL IMPRESSION: ?Pt is overall doing  very well with complete resolution of the pain that brought him to PT. However, he is still feeling tightness in Rt glutes and other deep hip rotators. He was willing to try dry needling today and demonstrated significant twitch response with good decrease in muscular tension. To help release pain at base of buttocks, instrument assisted soft tissue techniques performed with excellent improvement in restriction. Pt reported good improvement in tightness/discomfort with bending over/squatting at end of session. He will continue to benefit from skilled PT intervention in order to continue addressing Rt hip/pelvic tightness that is impacting strength training/football.  ?  ?  ?OBJECTIVE IMPAIRMENTS decreased ROM, increased muscle spasms, impaired flexibility, impaired tone, and pain.  ?  ?ACTIVITY LIMITATIONS  sport .  ?  ?PERSONAL FACTORS Time since onset of injury/illness/exacerbation are also affecting patient's functional outcome.  ?  ?  ?REHAB POTENTIAL: Excellent ?  ?CLINICAL DECISION MAKING:  Stable/uncomplicated ?  ?EVALUATION COMPLEXITY: Low ?  ?  ?GOALS: ?Goals reviewed with patient? Yes ?  ?SHORT TERM GOALS: Target date: 10/31/2021 ?  ?Pt will be able to understand source of pelvic pain and learn tools to ad

## 2021-11-07 ENCOUNTER — Ambulatory Visit: Payer: BC Managed Care – PPO | Admitting: Physical Therapy

## 2021-11-07 DIAGNOSIS — R293 Abnormal posture: Secondary | ICD-10-CM

## 2021-11-07 DIAGNOSIS — R252 Cramp and spasm: Secondary | ICD-10-CM

## 2021-11-07 NOTE — Therapy (Signed)
?OUTPATIENT PHYSICAL THERAPY TREATMENT NOTE ? ? ?Patient Name: Derrick Ellis ?MRN: [id number] ?DOB:[birth year], 21 y.o., male ?Today's Date: 11/07/2021 ? ?PCP: Marcelle Smiling, MD ?REFERRING PROVIDER: Pati Gallo, MD ? ?END OF SESSION:  ? PT End of Session - 11/07/21 0854   ? ? Visit Number 6   ? Date for PT Re-Evaluation 11/14/21   ? Authorization Type BCBS   ? Authorization - Visit Number 6   ? PT Start Time 0802   ? PT Stop Time 0846   ? PT Time Calculation (min) 44 min   ? ?  ?  ? ?  ? ? ?No past medical history on file. ?No past surgical history on file. ?There are no problems to display for this patient. ? ? 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 ?THERAPY DIAG:  ?Cramp and spasm ? ?Abnormal posture ? ?PERTINENT HISTORY: Rt ACL repair; Rt groin strain ? ?PRECAUTIONS: NA ? ?SUBJECTIVE: Pt states that he had to do max squats last night and felt significant tightness in Rt glutes. He reports that stretching is very helpful, but does not last long. He states that Rt groin/pelvic pain is much better, but he did feel some tightness at base of buttocks while descending into squat. The actual pelvic pain that brought him into PT is gone.  ? ?PAIN:  ?PAIN:  ?Are you having pain? No ?NPRS scale: 0/10 ?Pain location: Rt buttock/pelvic region  ?Pain orientation: Right  ?PAIN TYPE: tight ?Pain description:  tight   ?Aggravating factors: when hips uneven ?Relieving factors: stretching  ? ?  ?SUBJECTIVE:  ?  ?SUBJECTIVE STATEMENT: ?"I was really sore after the DN".  "I don't like needles."  "I'm about the same." ?  ?PERTINENT HISTORY: ?Pubic symphysis injection under fluoroscopy 09/29/21 ?Rt ACL reconstruction 04/02/2019, BPTB Autograft  ?  ?  ?PRECAUTIONS: None ?  ?WEIGHT BEARING RESTRICTIONS No ?  ?FALLS:  ?Has patient fallen in last 6 months? No ?  ?LIVING ENVIRONMENT: ?Lives with: dorm room, single ?Lives in: Other dorm room at college ?Stairs: sometimes pain with stairs ?Has following equipment at home:  None ?  ?OCCUPATION: student, full time ?  ?PLOF: Independent ?  ?PATIENT GOALS  ?Get rid of pain ?  ?  ?OBJECTIVE 10/24/21: Reduced and painful IR on Rt hip, no pain with resisted Rt adduction, tenderness externally to Rt OI; decreased stability with SL squat on Rt and Rt weight shift/Lt rotation with regular squat; significant tightness and tenderness throughout Rt glutes, hip flexors, and adductors; internal rectal exam demonstrated exquisite pain in Rt puborectalis and obturator internus ?  ?Objective 10/17/21: ? ?DIAGNOSTIC FINDINGS:  ?Pelvic MRI 09/11/21 ?Pubic symphysis injection under flouroscopy 09/29/21 ?  ?PATIENT SURVEYS:  ?FOTO 78%, goal 89% ?  ?COGNITION: ?          Overall cognitive status: Within functional limits for tasks assessed               ?           ?SENSATION: ?WFL ?  ?MUSCLE LENGTH: ?End range hamstrings limited ?Rt > Lt piriformis and gluteals limited ?  ?POSTURE:  ?scoliosis ?  ?PALPATION: ?Signif local tender to palpation inside Rt ischial tuberosity, pelvic floor ?TP present in Rt glut min, glut med, piriformis ?  ?LE ROM: ?  ?Active ROM Right ?10/17/2021 Left ?10/17/2021  ?Hip flexion      ?Hip extension      ?Hip abduction      ?Hip adduction      ?  Hip internal rotation      ?Hip external rotation 50 55  ?Knee flexion      ?Knee extension      ?Ankle dorsiflexion      ?Ankle plantarflexion      ?Ankle inversion      ?Ankle eversion      ? (Blank rows = not tested) ?  ?LE MMT: ?  ?5/5 with with recreation of sharp pelvic pain on testing Rt hip abduction ?  ?LOWER EXTREMITY SPECIAL TESTS:  ?Hip special tests: [name] (FABER) test: negative ?SI joints unremarkable ?  ?FUNCTIONAL TESTS:  ?External palpation of pelvic floor lift and bulge: min lift, lacks bulge .  Pt denies difficulty with defecation or pain with this. ?Active SLR: lacks pelvic control with Lt LE lift ?  ?  ?GAIT: ?WNL ?  ? TODAY'S TREATMENT 11/07/21: ?Manual: ?Soft tissue mobilization: ?Rt glutes/piriformis ?Dry needling: ?Rt  glutes/piriformis ?Right hip long axis distraction, inferior mobs, AP in internal rotation grade 4 3x 30 sec each; contract relax piriformis 3x 5 sec hold ? ? Exercises: ?Stretches review ?Strengthening: discussion of benefit of core strengthening glute bridge, side plank and traditional plank ?6 inch step down test with noted asymmetry between right/left ?RDL 15# kettlebell pt states he feels only in HS (discussed targeting medial reach and bent knee stance leg) ?Wall RDL with plyo ball against wall 10x to target glute med ? ?TODAY'S TREATMENT 11/03/21: ?Manual: ?Soft tissue mobilization: ?Rt glutes/piriformis/TFL ?Scar tissue mobilization: ?Myofascial release: ?Instrument assisted soft tissue mobs to Rt proximal hamstring/adductors ?Spinal mobilization: ?Internal pelvic floor techniques: ?Dry needling: ?Rt glutes/piriformis/TFL ?Neuromuscular re-education: ?Core retraining:  ?Core facilitation: ?Form correction: ?Pelvic floor contraction training: ?Down training: ?Exercises: ?Stretches/mobility: ?Strengthening: ?Therapeutic activities: ?Functional strengthening activities: ?Self-care: ? ? ? ?TREATMENT: ? ?11/02/21: ?Dynamic hip warm up along 20' 2 laps each: ?High knee to heel raise fwd walk ?Hip opener and closer walk ?Fig 4 walk ?Standing T walk ?Monster walk fwd/bwd and sidestepping red loop band ?Lunge walk fwd ? ?  Straddle adductor stretch standing bil 2x20" in hip flexion elbows on low mat table ?  Dynamic frog stretch standing x 10 reps using elbows inside knees for overpressure ?  Supine internal rotation Rt hip stretch x 1' to allow time for release ?  Seated on mat table fig 4 stretch x 1' to allow time for release ?  SLS on Rt standing on double blue balance pads 60 mini Lt hip flex/ext and abd/add for peterbation x 60 each ?  SLS on Rt LE 3-way clock taps x 10 rounds ?  Lateral bounding off rounded BOSU Lt foot to Rt LE landing with mirror for feedback x 10 reps ? ?STM: external approach trigger point  release glut min, med and piriformis in Lt SL ? ?Manual: ?Soft tissue mobilization: ?External obturator internus release ?Release to Rt glutes/pirifomris/hip rotators ?Release to Rt adductors/hip flexors ?Scar tissue mobilization: ?Myofascial release: ?Instrument assisted release to adductors and lumbar paraspinals on Rt ?Spinal mobilization: ?Internal pelvic floor techniques: ?Dry needling: ?Neuromuscular re-education: ?Core retraining:  ?Core facilitation: ?Form correction: ?Pelvic floor contraction training: ?Down training: ?Exercises: ?Stretches/mobility: ?Child's pose 4 min ?Child's pose with internal rotation 4 min ?Strengthening: ?Therapeutic activities: ?Functional strengthening activities: ?Self-care: ? ? ? ?  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  ?  ?HOME EXERCISE PROGRAM: ?Access Code: [email or web address] ?  URL: [email or web address] ?Date: 10/17/2021 ?Prepared by: Loistine Simas Beuhring ?  ?Exercises ?- Pigeon Pose  - 1 x daily - 7 x weekly - 1 sets - 3 reps - 30 hold ?- Quadruped Rocking Backward  - 1 x daily - 7 x weekly - 1 sets - 15 reps ?  ?ASSESSMENT: ?  ?CLINICAL IMPRESSION: ?Patient reluctant but agreeable to DN of gluteal and piriformis musculature.  Used a quick 5 sec pistoning technique which he was able to tolerate well.  A few twitch responses produced which is a good prognostic indicator.  Asymmetry noted with glute medius strength with compensations.  Cues on optimizing glute med activation instead of hamstrings.  Therapist monitoring response to all interventions and modifying treatment accordingly.   ?  ?  ?OBJECTIVE IMPAIRMENTS decreased ROM, increased muscle spasms, impaired flexibility, impaired tone, and pain.  ?  ?ACTIVITY LIMITATIONS  sport .  ?  ?PERSONAL  FACTORS Time since onset of injury/illness/exacerbation are also affecting patient's functional outcome.  ?  ?  ?REHAB POTENTIAL: Excellent ?  ?CLINICAL DECISION MAKING: Stable/uncomplicated ?  ?EVALUATION COMP

## 2021-11-09 ENCOUNTER — Encounter: Payer: PRIVATE HEALTH INSURANCE | Admitting: Physical Therapy

## 2021-11-09 ENCOUNTER — Ambulatory Visit: Payer: BC Managed Care – PPO

## 2021-11-09 DIAGNOSIS — R252 Cramp and spasm: Secondary | ICD-10-CM

## 2021-11-09 DIAGNOSIS — R293 Abnormal posture: Secondary | ICD-10-CM

## 2021-11-09 NOTE — Therapy (Signed)
?OUTPATIENT PHYSICAL THERAPY TREATMENT NOTE ? ? ?Patient Name: Derrick Ellis ?MRN: [id number] ?DOB:2001/03/11, 21 y.o., male ?Today's Date: 11/09/2021 ? ?PCP: Marcelle Smiling, MD ?REFERRING PROVIDER: Marcelle Smiling, MD ? ?END OF SESSION:  ? PT End of Session - 11/09/21 1236   ? ? Visit Number 7   ? Date for PT Re-Evaluation 11/14/21   ? Authorization Type BCBS   ? Authorization - Visit Number 7   ? Authorization - Number of Visits 30   ? PT Start Time 1234   ? PT Stop Time 1312   ? PT Time Calculation (min) 38 min   ? Activity Tolerance No increased pain   ? Behavior During Therapy [facility] for tasks assessed/performed   ? ?  ?  ? ?  ? ? ?History reviewed. No pertinent past medical history. ?History reviewed. No pertinent surgical history. ?There are no problems to display for this patient. ? ? 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 ?THERAPY DIAG:  ?Cramp and spasm ? ?Abnormal posture ? ?PERTINENT HISTORY: Rt ACL repair; Rt groin strain ? ?PRECAUTIONS: NA ? ?SUBJECTIVE: Pt states that he is having much more Rt deep pelvic pain ? ?PAIN:  ?PAIN:  ?Are you having pain? No ?NPRS scale: 0/10 ?Pain location: Rt buttock/pelvic region  ?Pain orientation: Right  ?PAIN TYPE: tight ?Pain description:  tight   ?Aggravating factors: when hips uneven ?Relieving factors: stretching  ? ?  ?SUBJECTIVE:  ?  ?SUBJECTIVE STATEMENT: ?"I was really sore after the DN".  "I don't like needles."  "I'm about the same." ?  ?PERTINENT HISTORY: ?Pubic symphysis injection under fluoroscopy 09/29/21 ?Rt ACL reconstruction 04/02/2019, BPTB Autograft  ?  ?  ?PRECAUTIONS: None ?  ?WEIGHT BEARING RESTRICTIONS No ?  ?FALLS:  ?Has patient fallen in last 6 months? No ?  ?LIVING ENVIRONMENT: ?Lives with: dorm room, single ?Lives in: Other dorm room at college ?Stairs: sometimes pain with stairs ?Has following equipment at home: None ?  ?OCCUPATION: student, full time ?  ?PLOF: Independent ?  ?PATIENT GOALS  ?Get rid of pain ?  ?  ?OBJECTIVE  10/24/21: Reduced and painful IR on Rt hip, no pain with resisted Rt adduction, tenderness externally to Rt OI; decreased stability with SL squat on Rt and Rt weight shift/Lt rotation with regular squat; significant tightness and tenderness throughout Rt glutes, hip flexors, and adductors; internal rectal exam demonstrated exquisite pain in Rt puborectalis and obturator internus ?  ?Objective 10/17/21: ? ?DIAGNOSTIC FINDINGS:  ?Pelvic MRI 09/11/21 ?Pubic symphysis injection under flouroscopy 09/29/21 ?  ?PATIENT SURVEYS:  ?FOTO 78%, goal 89% ?  ?COGNITION: ?          Overall cognitive status: Within functional limits for tasks assessed               ?           ?SENSATION: ?WFL ?  ?MUSCLE LENGTH: ?End range hamstrings limited ?Rt > Lt piriformis and gluteals limited ?  ?POSTURE:  ?scoliosis ?  ?PALPATION: ?Signif local tender to palpation inside Rt ischial tuberosity, pelvic floor ?TP present in Rt glut min, glut med, piriformis ?  ?LE ROM: ?  ?Active ROM Right ?10/17/2021 Left ?10/17/2021  ?Hip flexion      ?Hip extension      ?Hip abduction      ?Hip adduction      ?Hip internal rotation      ?Hip external rotation 50 55  ?Knee flexion      ?  Knee extension      ?Ankle dorsiflexion      ?Ankle plantarflexion      ?Ankle inversion      ?Ankle eversion      ? (Blank rows = not tested) ?  ?LE MMT: ?  ?5/5 with with recreation of sharp pelvic pain on testing Rt hip abduction ?  ?LOWER EXTREMITY SPECIAL TESTS:  ?Hip special tests: [name] (FABER) test: negative ?SI joints unremarkable ?  ?FUNCTIONAL TESTS:  ?External palpation of pelvic floor lift and bulge: min lift, lacks bulge .  Pt denies difficulty with defecation or pain with this. ?Active SLR: lacks pelvic control with Lt LE lift ?  ?  ?GAIT: ?WNL ?  ? TODAY'S TREATMENT 11/09/21: ?Manual: ?Soft tissue mobilization: ?Rt adductors, medial hamstrings, hip flexors, OI ?Scar tissue mobilization: ?Myofascial release: ?Instrument assisted to Rt adductors/medial hamstrings ?Spinal  mobilization: ?Internal pelvic floor techniques: ?Dry needling: ?Rt adductor (anterior and posterior approach) ?Neuromuscular re-education: ?Core retraining:  ?Core facilitation: ?Form correction: ?Pelvic floor contraction training: ?Down training: ?Exercises: ?Stretches/mobility: ?Crescent lunge windmill 10x bil ?Crescent lunge/runners lunge flossing 5x bil ?Strengthening: ?Single leg RDL hip rotation with slow eccentric lowering 10x Rt ?Therapeutic activities: ?Functional strengthening activities: ?Self-care: ? ? ? ?TREATMENT 11/07/21: ?Manual: ?Soft tissue mobilization: ?Rt glutes/piriformis ?Dry needling: ?Rt glutes/piriformis ?Right hip long axis distraction, inferior mobs, AP in internal rotation grade 4 3x 30 sec each; contract relax piriformis 3x 5 sec hold ? ? Exercises: ?Stretches review ?Strengthening: discussion of benefit of core strengthening glute bridge, side plank and traditional plank ?6 inch step down test with noted asymmetry between right/left ?RDL 15# kettlebell pt states he feels only in HS (discussed targeting medial reach and bent knee stance leg) ?Wall RDL with plyo ball against wall 10x to target glute med ? ?TODAY'S TREATMENT 11/03/21: ?Manual: ?Soft tissue mobilization: ?Rt glutes/piriformis/TFL ?Scar tissue mobilization: ?Myofascial release: ?Instrument assisted soft tissue mobs to Rt proximal hamstring/adductors ?Spinal mobilization: ?Internal pelvic floor techniques: ?Dry needling: ?Rt glutes/piriformis/TFL ?Neuromuscular re-education: ?Core retraining:  ?Core facilitation: ?Form correction: ?Pelvic floor contraction training: ?Down training: ?Exercises: ?Stretches/mobility: ?Strengthening: ?Therapeutic activities: ?Functional strengthening activities: ?Self-care: ? ? ? ?TREATMENT: ? ?11/02/21: ?Dynamic hip warm up along 20' 2 laps each: ?High knee to heel raise fwd walk ?Hip opener and closer walk ?Fig 4 walk ?Standing T walk ?Monster walk fwd/bwd and sidestepping red loop band ?Lunge  walk fwd ? ?  Straddle adductor stretch standing bil 2x20" in hip flexion elbows on low mat table ?  Dynamic frog stretch standing x 10 reps using elbows inside knees for overpressure ?  Supine internal rotation Rt hip stretch x 1' to allow time for release ?  Seated on mat table fig 4 stretch x 1' to allow time for release ?  SLS on Rt standing on double blue balance pads 60 mini Lt hip flex/ext and abd/add for peterbation x 60 each ?  SLS on Rt LE 3-way clock taps x 10 rounds ?  Lateral bounding off rounded BOSU Lt foot to Rt LE landing with mirror for feedback x 10 reps ? ?STM: external approach trigger point release glut min, med and piriformis in Lt SL ? ? ? ?  ?PATIENT EDUCATION:  ?Education details: Access Code: [email or web address] ?Person educated: Patient ?Education method: Explanation, Demonstration, and Handouts ?Education comprehension: verbalized understanding and returned demonstration ?  ?  ?HOME EXERCISE PROGRAM: ?Access Code: [email or web address] ?URL: [email or web address] ?Date: 10/17/2021 ?Prepared by: Loistine SimasJohanna Beuhring ?  ?Exercises ?- Rite AidPigeon Pose  - 1  x daily - 7 x weekly - 1 sets - 3 reps - 30 hold ?- Quadruped Rocking Backward  - 1 x daily - 7 x weekly - 1 sets - 15 reps ?  ?ASSESSMENT: ?  ?CLINICAL IMPRESSION: ?Patient feeling exacerbation of Rt deep pelvic pain today, most noted with deep lunge onto Lt leg. He was agreeable to DN and external manual techniques to Rt adductors, medial hamstrings, and OI. Sig twitch response to DN in adductors and pinpoint tenderness (not reproduction of pelvic pain) reproduced with deep soft tissue of proximal adductors. Palpation and soft tissue mobilization of Rt OI also did not reproduce pain - he stated the closest we came to hitting exact pain was as superior as we could go externally, possibly indicating that pain has pelvic floor component as well. We discussed need to return to internal rectal release to help access and decrease this pain. Good tolerance to  new stretches to help mobilize adductors and deep pelvic floor. He will continue to benefit from skilled PT intervention in order to decrease pain and return to functional activities/exercise without difficu

## 2021-11-14 ENCOUNTER — Encounter: Payer: PRIVATE HEALTH INSURANCE | Admitting: Physical Therapy

## 2021-11-14 ENCOUNTER — Ambulatory Visit: Payer: BC Managed Care – PPO | Attending: Sports Medicine

## 2021-11-14 DIAGNOSIS — R252 Cramp and spasm: Secondary | ICD-10-CM | POA: Insufficient documentation

## 2021-11-14 DIAGNOSIS — R293 Abnormal posture: Secondary | ICD-10-CM | POA: Insufficient documentation

## 2021-11-14 NOTE — Therapy (Signed)
?OUTPATIENT PHYSICAL THERAPY TREATMENT NOTE ? ? ?Patient Name: Derrick Ellis ?MRN: [id number] ?DOB:Jun 18, 2001, 21 y.o., male ?Today's Date: 11/14/2021 ? ?PCP: Derrick Smiling, MD ?REFERRING PROVIDER: Pati Gallo, MD ? ?END OF SESSION:  ? PT End of Session - 11/14/21 0932   ? ? Visit Number 8   ? Date for PT Re-Evaluation 02/06/22   ? Authorization Type BCBS   ? Authorization - Visit Number 8   ? Authorization - Number of Visits 30   ? PT Start Time [phone]   ? PT Stop Time 1010   ? PT Time Calculation (min) 39 min   ? Activity Tolerance Patient tolerated treatment well   ? Behavior During Therapy [facility] for tasks assessed/performed   ? ?  ?  ? ?  ? ? ?History reviewed. No pertinent past medical history. ?History reviewed. No pertinent surgical history. ?There are no problems to display for this patient. ? ? 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 ?THERAPY DIAG:  ?Cramp and spasm ? ?Abnormal posture ? ?PERTINENT HISTORY: Rt ACL repair; Rt groin strain ? ?PRECAUTIONS: NA ? ?SUBJECTIVE: Pt states that pain is better today. He states that he felt about the same Thursday after appointment and then got worse the next two days. The pain started to back off two days ago. He describes the increase in pain as sore and achy. He feels hesitant to have rectal work done because he was unsure if it was helpful before and very uncomfortable. ? ?PAIN:  ?PAIN:  ?Are you having pain? No ?NPRS scale: 0/10 ?Pain location: Rt buttock/pelvic region  ?Pain orientation: Right  ?PAIN TYPE: tight ?Pain description:  tight   ?Aggravating factors: when hips uneven ?Relieving factors: stretching  ? ?  ?SUBJECTIVE:  ? ?  ?PERTINENT HISTORY: ?Pubic symphysis injection under fluoroscopy 09/29/21 ?Rt ACL reconstruction 04/02/2019, BPTB Autograft  ?  ?  ?PRECAUTIONS: None ?  ?WEIGHT BEARING RESTRICTIONS No ?  ?FALLS:  ?Has patient fallen in last 6 months? No ?  ?LIVING ENVIRONMENT: ?Lives with: dorm room, single ?Lives in: Other dorm room at  college ?Stairs: sometimes pain with stairs ?Has following equipment at home: None ?  ?OCCUPATION: student, full time ?  ?PLOF: Independent ?  ?PATIENT GOALS  ?Get rid of pain ?  ?  ?OBJECTIVE 11/14/21: Squat WNL but patient reports increased Rt gluteal tightness (no loading); single leg squat with more knee instability and weight shift over Rt LE; lunge (normally reproduce pain lunging forward onto Lt at end range, but not currently) with more trunk rotation to Rt when lunging onto Rt; Rt glute strength 5/5, but tightness and pelvic pain with prolonged resistance; Rt hip adduction 4/5 with soreness and popping around Rt hip - no pelvic pain; tenderness with palpation of Rt ischial tuberosity and around obturator foramen/obturator internus in Rt sidelying; ASLR with resistance demonstrates increased core rotation with testing on Rt; *significant tenderness to palpation directly over Rt obturator internus ?FOTO: 85 ? ?10/24/21: Reduced and painful IR on Rt hip, no pain with resisted Rt adduction, tenderness externally to Rt OI; decreased stability with SL squat on Rt and Rt weight shift/Lt rotation with regular squat; significant tightness and tenderness throughout Rt glutes, hip flexors, and adductors; internal rectal exam demonstrated exquisite pain in Rt puborectalis and obturator internus ?  ?Objective 10/17/21: ? ?DIAGNOSTIC FINDINGS:  ?Pelvic MRI 09/11/21 ?Pubic symphysis injection under flouroscopy 09/29/21 ?  ?PATIENT SURVEYS:  ?FOTO 78%, goal 89% ?  ?COGNITION: ?  Overall cognitive status: Within functional limits for tasks assessed               ?           ?SENSATION: ?WFL ?  ?MUSCLE LENGTH: ?End range hamstrings limited ?Rt > Lt piriformis and gluteals limited ?  ?POSTURE:  ?scoliosis ?  ?PALPATION: ?Signif local tender to palpation inside Rt ischial tuberosity, pelvic floor ?TP present in Rt glut min, glut med, piriformis ?  ?LE ROM: ?  ?Active ROM Right ?10/17/2021 Left ?10/17/2021  ?Hip flexion      ?Hip  extension      ?Hip abduction      ?Hip adduction      ?Hip internal rotation      ?Hip external rotation 50 55  ?Knee flexion      ?Knee extension      ?Ankle dorsiflexion      ?Ankle plantarflexion      ?Ankle inversion      ?Ankle eversion      ? (Blank rows = not tested) ?  ?LE MMT: ?  ?5/5 with with recreation of sharp pelvic pain on testing Rt hip abduction ?  ?LOWER EXTREMITY SPECIAL TESTS:  ?Hip special tests: Derrick Ellis (FABER) test: negative ?SI joints unremarkable ?  ?FUNCTIONAL TESTS:  ?External palpation of pelvic floor lift and bulge: min lift, lacks bulge .  Pt denies difficulty with defecation or pain with this. ?Active SLR: lacks pelvic control with Lt LE lift ?  ?  ?GAIT: ?WNL ?  ? TODAY'S TREATMENT 11/09/21: ?Re-evaluation ?Manual: ?Soft tissue mobilization: ?Rt adductors, medial hamstrings, hip flexors, OI ?Scar tissue mobilization: ?Myofascial release: ?Instrument assisted to Rt adductors/medial hamstrings ?Spinal mobilization: ?Internal pelvic floor techniques: ?Dry needling: ?Rt adductor (anterior and posterior approach) ?Neuromuscular re-education: ?Core retraining:  ?Core facilitation: ?Form correction: ?Pelvic floor contraction training: ?Down training: ?Exercises: ?Stretches/mobility: ?Crescent lunge windmill 10x bil ?Crescent lunge/runners lunge flossing 5x bil ?Strengthening: ?Single leg RDL hip rotation with slow eccentric lowering 10x Rt ?Therapeutic activities: ?Functional strengthening activities: ?Self-care: ? ? ? ?TREATMENT 11/07/21: ?Manual: ?Soft tissue mobilization: ?Rt glutes/piriformis ?Dry needling: ?Rt glutes/piriformis ?Right hip long axis distraction, inferior mobs, AP in internal rotation grade 4 3x 30 sec each; contract relax piriformis 3x 5 sec hold ? ? Exercises: ?Stretches review ?Strengthening: discussion of benefit of core strengthening glute bridge, side plank and traditional plank ?6 inch step down test with noted asymmetry between right/left ?RDL 15# kettlebell pt  states he feels only in HS (discussed targeting medial reach and bent knee stance leg) ?Wall RDL with plyo ball against wall 10x to target glute med ? ?TODAY'S TREATMENT 11/03/21: ?Manual: ?Soft tissue mobilization: ?Rt glutes/piriformis/TFL ?Scar tissue mobilization: ?Myofascial release: ?Instrument assisted soft tissue mobs to Rt proximal hamstring/adductors ?Spinal mobilization: ?Internal pelvic floor techniques: ?Dry needling: ?Rt glutes/piriformis/TFL ?Neuromuscular re-education: ?Core retraining:  ?Core facilitation: ?Form correction: ?Pelvic floor contraction training: ?Down training: ?Exercises: ?Stretches/mobility: ?Strengthening: ?Therapeutic activities: ?Functional strengthening activities: ?Self-care: ? ? ? ?  ?PATIENT EDUCATION:  ?Education details: Access Code: [email or web address] ?Person educated: Patient ?Education method: Explanation, Demonstration, and Handouts ?Education comprehension: verbalized understanding and returned demonstration ?  ?  ?HOME EXERCISE PROGRAM: ?Access Code: [email or web address] ?URL: [email or web address] ?Date: 10/17/2021 ?Prepared by: Loistine SimasJohanna Beuhring ?  ?Exercises ?- Pigeon Pose  - 1 x daily - 7 x weekly - 1 sets - 3 reps - 30 hold ?- Quadruped Rocking Backward  - 1 x daily - 7 x weekly - 1 sets - 15 reps ?  ?  ASSESSMENT: ?  ?CLINICAL IMPRESSION: ?Pt had exacerbation of pain at end of last week for unknown reason, but is feeling much better today. He states that overall he is noticing that he has more days where pelvic pain is significantly improved. He does still demonstrate functional weakness/stability in Rt core/hip that could be contributing to pelvic floor/obturator spasm. He continues to present with pinpoint obturator internus pain, but also suspect Rt posterior pelvic floor dysfunction as well. Pt deferred rectal assessment of Rt obturator/pelvic floor this session. He did tolerate external manual techniques well and reported good decrease in pain after treatment session.  He will continue to benefit from skilled PT intervention in order to decrease pain and return to functional activities/exercise without difficulty.  ?  ?  ?OBJECTIVE IMPAIRMENTS decreased ROM, increased mu

## 2021-11-16 ENCOUNTER — Encounter: Payer: PRIVATE HEALTH INSURANCE | Admitting: Physical Therapy

## 2021-11-20 ENCOUNTER — Ambulatory Visit: Payer: BC Managed Care – PPO

## 2021-11-20 DIAGNOSIS — R252 Cramp and spasm: Secondary | ICD-10-CM

## 2021-11-20 DIAGNOSIS — R293 Abnormal posture: Secondary | ICD-10-CM

## 2021-11-20 NOTE — Therapy (Signed)
?OUTPATIENT PHYSICAL THERAPY TREATMENT NOTE ? ? ?Patient Name: Derrick Ellis ?MRN: [id number] ?DOB:[birth year], 21 y.o., male ?Today's Date: 11/20/2021 ? ?PCP: Marcelle SmilingWebb, Thomas S, MD ?REFERRING PROVIDER: Pati GalloKramer, James, MD ? ?END OF SESSION:  ? PT End of Session - 11/20/21 1444   ? ? Visit Number 9   ? Date for PT Re-Evaluation 02/06/22   ? Authorization Type BCBS   ? Authorization - Visit Number 9   ? Authorization - Number of Visits 30   ? PT Start Time 1445   ? PT Stop Time 1525   ? PT Time Calculation (min) 40 min   ? Activity Tolerance Patient tolerated treatment well   ? [facility]   ? ?  ?  ? ?  ? ? ?History reviewed. No pertinent past medical history. ?History reviewed. No pertinent surgical history. ?There are no problems to display for this patient. ? ? 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 ?THERAPY DIAG:  ?Cramp and spasm ? ?Abnormal posture ? ?PERTINENT HISTORY: Rt ACL repair; Rt groin strain ? ?PRECAUTIONS: NA ? ?SUBJECTIVE: Pt states that he found a place in [place] for pelvic floor PT and will call to schedule this week. He states that he had at least 4 days of pain free works outs where he was able to push really hard. He noticed that he was sore during work out today and had some pain afterwards in Rt pelvic floor. He is noticing less Rt gluteal tightness. He would like to focus on manual techniques externally to pelvic/obturator internus. ? ?PAIN:  ?PAIN:  ?Are you having pain? No ?NPRS scale: 0/10 ?Pain location: Rt buttock/pelvic region  ?Pain orientation: Right  ?PAIN TYPE: tight ?Pain description:  tight   ?Aggravating factors: when hips uneven ?Relieving factors: stretching  ? ?  ?SUBJECTIVE:  ? ?  ?PERTINENT HISTORY: ?Pubic symphysis injection under fluoroscopy 09/29/21 ?Rt ACL reconstruction 04/02/2019, BPTB Autograft  ?  ?  ?PRECAUTIONS: None ?  ?WEIGHT BEARING RESTRICTIONS No ?  ?FALLS:  ?Has patient fallen in last 6 months? No ?   ?LIVING ENVIRONMENT: ?Lives with: dorm room, single ?Lives in: Other dorm room at college ?Stairs: sometimes pain with stairs ?Has following equipment at home: None ?  ?OCCUPATION: student, full time ?  ?PLOF: Independent ?  ?PATIENT GOALS  ?Get rid of pain ?  ?  ?OBJECTIVE 11/14/21: Squat WNL but patient reports increased Rt gluteal tightness (no loading); single leg squat with more knee instability and weight shift over Rt LE; lunge (normally reproduce pain lunging forward onto Lt at end range, but not currently) with more trunk rotation to Rt when lunging onto Rt; Rt glute strength 5/5, but tightness and pelvic pain with prolonged resistance; Rt hip adduction 4/5 with soreness and popping around Rt hip - no pelvic pain; tenderness with palpation of Rt ischial tuberosity and around obturator foramen/obturator internus in Rt sidelying; ASLR with resistance demonstrates increased core rotation with testing on Rt; *significant tenderness to palpation directly over Rt obturator internus ?FOTO: 85 ? ?10/24/21: Reduced and painful IR on Rt hip, no pain with resisted Rt adduction, tenderness externally to Rt OI; decreased stability with SL squat on Rt and Rt weight shift/Lt rotation with regular squat; significant tightness and tenderness throughout Rt glutes, hip flexors, and adductors; internal rectal exam demonstrated exquisite pain in Rt puborectalis and obturator internus ?  ?Objective 10/17/21: ? ?DIAGNOSTIC FINDINGS:  ?Pelvic MRI 09/11/21 ?Pubic symphysis injection under flouroscopy 09/29/21 ?  ?  PATIENT SURVEYS:  ?FOTO 78%, goal 89% ?  ?COGNITION: ?          Overall cognitive status: Within functional limits for tasks assessed               ?           ?SENSATION: ?WFL ?  ?MUSCLE LENGTH: ?End range hamstrings limited ?Rt > Lt piriformis and gluteals limited ?  ?POSTURE:  ?scoliosis ?  ?PALPATION: ?Signif local tender to palpation inside Rt ischial tuberosity, pelvic floor ?TP present in Rt glut min, glut med,  piriformis ?  ?LE ROM: ?  ?Active ROM Right ?10/17/2021 Left ?10/17/2021  ?Hip flexion      ?Hip extension      ?Hip abduction      ?Hip adduction      ?Hip internal rotation      ?Hip external rotation 50 55  ?Knee flexion      ?Knee extension      ?Ankle dorsiflexion      ?Ankle plantarflexion      ?Ankle inversion      ?Ankle eversion      ? (Blank rows = not tested) ?  ?LE MMT: ?  ?5/5 with with recreation of sharp pelvic pain on testing Rt hip abduction ?  ?LOWER EXTREMITY SPECIAL TESTS:  ?Hip special tests: Luisa Hart (FABER) test: negative ?SI joints unremarkable ?  ?FUNCTIONAL TESTS:  ?External palpation of pelvic floor lift and bulge: min lift, lacks bulge .  Pt denies difficulty with defecation or pain with this. ?Active SLR: lacks pelvic control with Lt LE lift ?  ?  ?GAIT: ?WNL ?  ? TODAY'S TREATMENT 11/20/21 ?Manual: ?Soft tissue mobilization: ?Rt adductors, medial hamstrings, hip flexors, OI ?Scar tissue mobilization: ?Myofascial release: ?Instrument assisted to Rt adductors/medial hamstrings ?Spinal mobilization: ?Internal pelvic floor techniques: ?Dry needling: ?Neuromuscular re-education: ?Core retraining:  ?Transversus abdominus training with breath coordination and discussed how to begin utilizing specific contraction into strengthening moves with breath  ?Core facilitation: ?Form correction: ?Pelvic floor contraction training: ?Down training: ?Exercises: ?Stretches/mobility: ?Strengthening: ?Therapeutic activities: ?Functional strengthening activities: ?Self-care: ? ? ? ?TREATMENT 11/09/21: ?Re-evaluation ?Manual: ?Soft tissue mobilization: ?Rt adductors, medial hamstrings, hip flexors, OI ?Scar tissue mobilization: ?Myofascial release: ?Instrument assisted to Rt adductors/medial hamstrings ?Spinal mobilization: ?Internal pelvic floor techniques: ?Dry needling: ?Rt adductor (anterior and posterior approach) ?Neuromuscular re-education: ?Core retraining:  ?Core facilitation: ?Form correction: ?Pelvic floor  contraction training: ?Down training: ?Exercises: ?Stretches/mobility: ?Crescent lunge windmill 10x bil ?Crescent lunge/runners lunge flossing 5x bil ?Strengthening: ?Single leg RDL hip rotation with slow eccentric lowering 10x Rt ?Therapeutic activities: ?Functional strengthening activities: ?Self-care: ? ? ? ?TREATMENT 11/07/21: ?Manual: ?Soft tissue mobilization: ?Rt glutes/piriformis ?Dry needling: ?Rt glutes/piriformis ?Right hip long axis distraction, inferior mobs, AP in internal rotation grade 4 3x 30 sec each; contract relax piriformis 3x 5 sec hold ? ? Exercises: ?Stretches review ?Strengthening: discussion of benefit of core strengthening glute bridge, side plank and traditional plank ?6 inch step down test with noted asymmetry between right/left ?RDL 15# kettlebell pt states he feels only in HS (discussed targeting medial reach and bent knee stance leg) ?Wall RDL with plyo ball against wall 10x to target glute med ? ? ?  ?PATIENT EDUCATION:  ?Education details: Access Code: [email or web address] ?Person educated: Patient ?Education method: Explanation, Demonstration, and Handouts ?Education comprehension: verbalized understanding and returned demonstration ?  ?  ?HOME EXERCISE PROGRAM: ?Access Code: [email or web address] ?URL: [email or web address] ?Date: 10/17/2021 ?Prepared by: Loistine Simas Beuhring ?  ?Exercises ?-  Pigeon Pose  - 1 x daily - 7 x weekly - 1 sets - 3 reps - 30 hold ?- Quadruped Rocking Backward  - 1 x daily - 7 x weekly - 1 sets - 15 reps ?  ?ASSESSMENT: ?  ?CLINICAL IMPRESSION: ?Last treatment session helpful with 4 days of significantly improved pain despite difficult work outs. We reproduced manual treatment with soft tissue mobilization and myofascial release using massage tool to Rt obturator internus and adductors with good tolerance. Obturator internus demonstrated great release throughout treatment session. Due to report of pain reproduction with lateral stepping, we started discussed deep core  activation/stability training. Transversus abdominus training performed with cues of lifting erect penis with exhale being most effective; he was encouraged to work on incorporating this into work outs.  He will con

## 2021-11-30 ENCOUNTER — Ambulatory Visit: Payer: BC Managed Care – PPO | Admitting: Physical Therapy

## 2021-11-30 ENCOUNTER — Encounter: Payer: Self-pay | Admitting: Physical Therapy

## 2021-11-30 DIAGNOSIS — R293 Abnormal posture: Secondary | ICD-10-CM

## 2021-11-30 DIAGNOSIS — R252 Cramp and spasm: Secondary | ICD-10-CM | POA: Diagnosis not present

## 2021-11-30 NOTE — Therapy (Signed)
OUTPATIENT PHYSICAL THERAPY TREATMENT NOTE   Patient Name: Allah Reason MRN: [id number] DOB:[birth year], 21 y.o., male Today's Date: 11/30/2021  PCP: Cristine Polio, MD REFERRING PROVIDER: Berle Mull, MD  END OF SESSION:   PT End of Session - 11/30/21 1536     Visit Number 10    Date for PT Re-Evaluation 02/06/22    Authorization Type BCBS    Authorization - Visit Number 10    Authorization - Number of Visits 30    PT Start Time 3557    PT Stop Time 1535    PT Time Calculation (min) 49 min    Activity Tolerance Patient tolerated treatment well    Behavior During Therapy [facility] for tasks assessed/performed              History reviewed. No pertinent past medical history. History reviewed. No pertinent surgical history. There are no problems to display for this patient.   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    PAIN:  PAIN:  Are you having pain? No NPRS scale: 7-8/10, intermittent Pain location: Rt buttock/pelvic region  Pain orientation: Right  PAIN TYPE: tight Pain description:  tight   Aggravating factors: lunge, when Lt foot is forward, getting in/out of car Relieving factors: stretching     SUBJECTIVE:     PERTINENT HISTORY: Pubic symphysis injection under fluoroscopy 09/29/21 Rt ACL reconstruction 04/02/2019, BPTB Autograft      PRECAUTIONS: None   WEIGHT BEARING RESTRICTIONS No   FALLS:  Has patient fallen in last 6 months? No   LIVING ENVIRONMENT: Lives with: dorm room, single Lives in: Other dorm room at college Stairs: sometimes pain with stairs Has following equipment at home: None   OCCUPATION: student, full time   PLOF: Independent   PATIENT GOALS  Get  rid of pain     OBJECTIVE 11/14/21: Squat WNL but patient reports increased Rt gluteal tightness (no loading); single leg squat with more knee instability and weight shift over Rt LE; lunge (normally reproduce pain lunging forward onto Lt at end range, but not currently) with more trunk rotation to Rt when lunging onto Rt; Rt glute strength 5/5, but tightness and pelvic pain with prolonged resistance; Rt hip adduction 4/5 with soreness and popping around Rt hip - no pelvic pain; tenderness with palpation of Rt ischial tuberosity and around obturator foramen/obturator internus in Rt sidelying; ASLR with resistance demonstrates increased core rotation with testing on Rt; *significant tenderness to palpation directly over Rt obturator internus FOTO: 85  10/24/21: Reduced and painful IR on Rt hip, no pain with resisted Rt adduction, tenderness externally to Rt OI; decreased stability with SL squat on Rt and Rt weight shift/Lt rotation with regular squat; significant tightness and tenderness throughout Rt glutes, hip flexors, and adductors; internal rectal exam demonstrated exquisite pain in Rt puborectalis and obturator internus   Objective 10/17/21:  DIAGNOSTIC FINDINGS:  Pelvic MRI 09/11/21 Pubic symphysis injection under flouroscopy 09/29/21   PATIENT SURVEYS:  FOTO 78%, goal 89%   COGNITION:           Overall cognitive status: Within functional limits for tasks assessed  SENSATION: WFL   MUSCLE LENGTH: End range hamstrings limited Rt > Lt piriformis and gluteals limited   POSTURE:  scoliosis   PALPATION: Signif local tender to palpation inside Rt ischial tuberosity, pelvic floor TP present in Rt glut min, glut med, piriformis   LE ROM:   Active ROM Right 10/17/2021 Left 10/17/2021  Hip flexion      Hip extension      Hip abduction      Hip adduction      Hip internal rotation      Hip external rotation 50 55  Knee flexion      Knee extension      Ankle  dorsiflexion      Ankle plantarflexion      Ankle inversion      Ankle eversion       (Blank rows = not tested)   LE MMT:   5/5 with with recreation of sharp pelvic pain on testing Rt hip abduction   LOWER EXTREMITY SPECIAL TESTS:  Hip special tests: Saralyn Pilar (FABER) test: negative SI joints unremarkable   FUNCTIONAL TESTS:  External palpation of pelvic floor lift and bulge: min lift, lacks bulge .  Pt denies difficulty with defecation or pain with this. Active SLR: lacks pelvic control with Lt LE lift     GAIT: WNL     TODAY'S TREATMENT 11/30/21  Trigger Point Dry-Needling  Treatment instructions: Expect mild to moderate muscle soreness. S/S of pneumothorax if dry needled over a lung field, and to seek immediate medical attention should they occur. Patient verbalized understanding of these instructions and education.  Patient Consent Given: Yes Education handout provided: Previously provided Muscles treated: Rt glut med and piriformis Electrical stimulation performed: No Parameters: N/A Treatment response/outcome: deep ache reported with DN  Manual: Soft tissue mobilization: Rt adductors, medial hamstrings, adductor magnus, OI    TODAY'S TREATMENT 11/20/21 Manual: Soft tissue mobilization: Rt adductors, medial hamstrings, hip flexors, OI Scar tissue mobilization: Myofascial release: Instrument assisted to Rt adductors/medial hamstrings Spinal mobilization: Internal pelvic floor techniques: Dry needling: Neuromuscular re-education: Core retraining:  Transversus abdominus training with breath coordination and discussed how to begin utilizing specific contraction into strengthening moves with breath  Core facilitation: Form correction: Pelvic floor contraction training: Down training: Exercises: Stretches/mobility: Strengthening: Therapeutic activities: Functional strengthening activities: Self-care:    TREATMENT 11/09/21: Re-evaluation Manual: Soft tissue  mobilization: Rt adductors, medial hamstrings, hip flexors, OI Scar tissue mobilization: Myofascial release: Instrument assisted to Rt adductors/medial hamstrings Spinal mobilization: Internal pelvic floor techniques: Dry needling: Rt adductor (anterior and posterior approach) Neuromuscular re-education: Core retraining:  Core facilitation: Form correction: Pelvic floor contraction training: Down training: Exercises: Stretches/mobility: Crescent lunge windmill 10x bil Crescent lunge/runners lunge flossing 5x bil Strengthening: Single leg RDL hip rotation with slow eccentric lowering 10x Rt Therapeutic activities: Functional strengthening activities: Self-care:    TREATMENT 11/07/21: Manual: Soft tissue mobilization: Rt glutes/piriformis Dry needling: Rt glutes/piriformis Right hip long axis distraction, inferior mobs, AP in internal rotation grade 4 3x 30 sec each; contract relax piriformis 3x 5 sec hold   Exercises: Stretches review Strengthening: discussion of benefit of core strengthening glute bridge, side plank and traditional plank 6 inch step down test with noted asymmetry between right/left RDL 15# kettlebell pt states he feels only in HS (discussed targeting medial reach and bent knee stance leg) Wall RDL with plyo ball against wall 10x to target glute med    PATIENT EDUCATION:  Education details: Access Code: [email or web address] Person educated: Patient Education  method: Explanation, Demonstration, and Handouts Education comprehension: verbalized understanding and returned demonstration     HOME EXERCISE PROGRAM: Access Code: [email or web address] URL: [email or web address] Date: 10/17/2021 Prepared by: Venetia Night Linkin Vizzini   Exercises - Pigeon Pose  - 1 x daily - 7 x weekly - 1 sets - 3 reps - 30 hold - Quadruped Rocking Backward  - 1 x daily - 7 x weekly - 1 sets - 15 reps   ASSESSMENT:   CLINICAL IMPRESSION: Pt continues to report 4 days of relief  following PT sessions, but reports pain returns after 4 days.  Pain continues to be exacerbated by lunging type moves.  He has signif tenderness and tension in Rt proximal medial adductors, medial hamstrings and OI.  He agreed to lateral hip DN today with report of deep ache with needle entry into glut med and piriformis. Pt was able to guide treating therapist to pinpoint areas needing release surrounding posterior and inferior pelvis.  Pt may be transferring his therapy to [facility] but will know more next week.       OBJECTIVE IMPAIRMENTS decreased ROM, increased muscle spasms, impaired flexibility, impaired tone, and pain.    ACTIVITY LIMITATIONS  sport .    PERSONAL FACTORS Time since onset of injury/illness/exacerbation are also affecting patient's functional outcome.      REHAB POTENTIAL: Excellent   CLINICAL DECISION MAKING: Stable/uncomplicated   EVALUATION COMPLEXITY: Low     GOALS: Goals reviewed with patient? Yes   SHORT TERM GOALS: Target date: 10/31/2021   Pt will be able to understand source of pelvic pain and learn tools to address dysfunction. Baseline: Goal status: MET        LONG TERM GOALS: Target date: 11/14/2021   Pt will report improved pelvic pain by at least 50% with exercise. Baseline: patient states that it depends - some days he feels like he is easily 50% better, then others he has just as much pain Goal status: IN PROGRESS   2.  Pt will be ind with HEP and understand how to safely progress. Baseline: Patient working on progressing Goal status: IN PROGRESS   3.  Improve FOTO score to at least 89% to demo improved function. Baseline: 78% - improved to 85% Goal status: IN PROGRESS   4.     PLAN: PT FREQUENCY: 1-2x/week   PT DURATION: 12 weeks   PLANNED INTERVENTIONS: Therapeutic exercises, Therapeutic activity, Neuromuscular re-education, Balance training, Patient/Family education, Joint mobilization, Dry Needling, Electrical stimulation,  Spinal mobilization, Cryotherapy, Moist heat, Biofeedback, and Manual therapy   PLAN FOR NEXT SESSION: DN to Rt glutes/hip rotators/adductors/hip flexors; core/pelvic stability exercise progression to tolerance.    Nayleen Janosik, PT 11/30/21 3:42 PM

## 2021-12-07 ENCOUNTER — Encounter: Payer: Self-pay | Admitting: Physical Therapy

## 2021-12-07 ENCOUNTER — Ambulatory Visit: Payer: BC Managed Care – PPO | Admitting: Physical Therapy

## 2021-12-07 DIAGNOSIS — R252 Cramp and spasm: Secondary | ICD-10-CM | POA: Diagnosis not present

## 2021-12-07 DIAGNOSIS — R293 Abnormal posture: Secondary | ICD-10-CM

## 2021-12-07 NOTE — Therapy (Signed)
OUTPATIENT PHYSICAL THERAPY TREATMENT NOTE   Patient Name: Derrick Ellis MRN: [id number] DOB:[birth year], 21 y.o., male Today's Date: 12/07/2021  PCP: Cristine Polio, MD REFERRING PROVIDER: Berle Mull, MD  END OF SESSION:   PT End of Session - 12/07/21 1444     Visit Number 11    Date for PT Re-Evaluation 02/06/22    Authorization Type BCBS    Authorization - Visit Number 11    Authorization - Number of Visits 30    PT Start Time 3716    PT Stop Time 9678    PT Time Calculation (min) 45 min    Activity Tolerance Patient tolerated treatment well    Behavior During Therapy [facility] for tasks assessed/performed               History reviewed. No pertinent past medical history. History reviewed. No pertinent surgical history. There are no problems to display for this patient.   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)  THERAPY DIAG:  Cramp and spasm  Abnormal posture  PERTINENT HISTORY: Rt ACL repair; Rt groin strain  PRECAUTIONS: NA  SUBJECTIVE: I have been doing all my training and haven't had any pain in the last 5 days.  I have been very surprised.  PAIN:  PAIN:  Are you having pain? No NPRS scale: 0/10, intermittent Pain location: Rt buttock/pelvic region  Pain orientation: Right  PAIN TYPE: tight Pain description:  tight   Aggravating factors: lunge, when Lt foot is forward, getting in/out of car Relieving factors: stretching     SUBJECTIVE:     PERTINENT HISTORY: Pubic symphysis injection under fluoroscopy 09/29/21 Rt ACL reconstruction 04/02/2019, BPTB Autograft      PRECAUTIONS: None   WEIGHT BEARING RESTRICTIONS No   FALLS:  Has patient fallen in last 6 months? No   LIVING ENVIRONMENT: Lives with: dorm room, single Lives in: Other dorm room at college Stairs: sometimes pain with stairs Has following equipment at home: None   OCCUPATION: student, full time   PLOF: Independent   PATIENT GOALS  Get rid of pain      OBJECTIVE 11/14/21: Squat WNL but patient reports increased Rt gluteal tightness (no loading); single leg squat with more knee instability and weight shift over Rt LE; lunge (normally reproduce pain lunging forward onto Lt at end range, but not currently) with more trunk rotation to Rt when lunging onto Rt; Rt glute strength 5/5, but tightness and pelvic pain with prolonged resistance; Rt hip adduction 4/5 with soreness and popping around Rt hip - no pelvic pain; tenderness with palpation of Rt ischial tuberosity and around obturator foramen/obturator internus in Rt sidelying; ASLR with resistance demonstrates increased core rotation with testing on Rt; *significant tenderness to palpation directly over Rt obturator internus FOTO: 85  10/24/21: Reduced and painful IR on Rt hip, no pain with resisted Rt adduction, tenderness externally to Rt OI; decreased stability with SL squat on Rt and Rt weight shift/Lt rotation with regular squat; significant tightness and tenderness throughout Rt glutes, hip flexors, and adductors; internal rectal exam demonstrated exquisite pain in Rt puborectalis and obturator internus   Objective 10/17/21:  DIAGNOSTIC FINDINGS:  Pelvic MRI 09/11/21 Pubic symphysis injection under flouroscopy 09/29/21   PATIENT SURVEYS:  FOTO 78%, goal 89%   COGNITION:           Overall cognitive status: Within functional limits for tasks assessed  SENSATION: WFL   MUSCLE LENGTH: End range hamstrings limited Rt > Lt piriformis and gluteals limited   POSTURE:  scoliosis   PALPATION: Signif local tender to palpation inside Rt ischial tuberosity, pelvic floor TP present in Rt glut min, glut med, piriformis   LE ROM:   Active ROM Right 10/17/2021 Left 10/17/2021  Hip flexion      Hip extension      Hip abduction      Hip adduction      Hip internal rotation      Hip external rotation 50 55  Knee flexion      Knee extension      Ankle dorsiflexion       Ankle plantarflexion      Ankle inversion      Ankle eversion       (Blank rows = not tested)   LE MMT:   5/5 with with recreation of sharp pelvic pain on testing Rt hip abduction   LOWER EXTREMITY SPECIAL TESTS:  Hip special tests: Saralyn Pilar (FABER) test: negative SI joints unremarkable   FUNCTIONAL TESTS:  External palpation of pelvic floor lift and bulge: min lift, lacks bulge .  Pt denies difficulty with defecation or pain with this. Active SLR: lacks pelvic control with Lt LE lift     GAIT: WNL      TODAY'S TREATMENT 12/07/21   Passive dynamic stretching of Rt adductors into flexion/ER   STM to adductors, adductor magnus, adductor tendons   Active release Rt adductors with fist pressure into medial belly with contract/relax 5x5" in Rt  SL OI external TP release Trigger Point Dry-Needling  Treatment instructions: Expect mild to moderate muscle soreness. S/S of pneumothorax if dry needled over a lung field, and to seek immediate medical attention should they occur. Patient verbalized understanding of these instructions and education.  Patient Consent Given: Yes Education handout provided: Previously provided Muscles treated: glut medius on Rt Electrical stimulation performed: No Parameters: N/A Treatment response/outcome: signif twitch and release today  Standing contract/relax at end range Rt hamstring and adductor stretch foot on chair 5x5" each      TODAY'S TREATMENT 11/30/21  Trigger Point Dry-Needling  Treatment instructions: Expect mild to moderate muscle soreness. S/S of pneumothorax if dry needled over a lung field, and to seek immediate medical attention should they occur. Patient verbalized understanding of these instructions and education.  Patient Consent Given: Yes Education handout provided: Previously provided Muscles treated: Rt glut med and piriformis Electrical stimulation performed: No Parameters: N/A Treatment response/outcome: deep ache reported  with DN  Manual: Soft tissue mobilization: Rt adductors, medial hamstrings, adductor magnus, OI    TODAY'S TREATMENT 11/20/21 Manual: Soft tissue mobilization: Rt adductors, medial hamstrings, hip flexors, OI Scar tissue mobilization: Myofascial release: Instrument assisted to Rt adductors/medial hamstrings Spinal mobilization: Internal pelvic floor techniques: Dry needling: Neuromuscular re-education: Core retraining:  Transversus abdominus training with breath coordination and discussed how to begin utilizing specific contraction into strengthening moves with breath  Core facilitation: Form correction: Pelvic floor contraction training: Down training: Exercises: Stretches/mobility: Strengthening: Therapeutic activities: Functional strengthening activities: Self-care:    PATIENT EDUCATION:  Education details: Access Code: [email or web address] Person educated: Patient Education method: Explanation, Demonstration, and Handouts Education comprehension: verbalized understanding and returned demonstration     HOME EXERCISE PROGRAM: Access Code: [email or web address] URL: [email or web address] Date: 10/17/2021 Prepared by: Baruch Merl   Exercises - Pigeon Pose  - 1 x daily - 7 x weekly - 1 sets - 3 reps -  30 hold - Quadruped Rocking Backward  - 1 x daily - 7 x weekly - 1 sets - 15 reps   ASSESSMENT:   CLINICAL IMPRESSION: Pt reported he was pleasantly surprised that he has not had any pain for the last 5 days while training. He continues to have signif restriction and heightened tone in adductors and tenderness along muscle bellies and proximal tendons on Rt.  He was less reactive in external palpation of OI today although still tender.  He had + twitch/release with DN today to Rt glut med with most improved resting state of glut med following DN today to date.   PT initiated some active release to Rt adductors today and instructed Pt on contract/relax at end range of hamstring  and adductor stretching.  Continue along POC with ongoing assessment of response to treatment interventions.      OBJECTIVE IMPAIRMENTS decreased ROM, increased muscle spasms, impaired flexibility, impaired tone, and pain.    ACTIVITY LIMITATIONS  sport .    PERSONAL FACTORS Time since onset of injury/illness/exacerbation are also affecting patient's functional outcome.      REHAB POTENTIAL: Excellent   CLINICAL DECISION MAKING: Stable/uncomplicated   EVALUATION COMPLEXITY: Low     GOALS: Goals reviewed with patient? Yes   SHORT TERM GOALS: Target date: 10/31/2021   Pt will be able to understand source of pelvic pain and learn tools to address dysfunction. Baseline: Goal status: MET        LONG TERM GOALS: Target date: 11/14/2021   Pt will report improved pelvic pain by at least 50% with exercise. Baseline: patient states that it depends - some days he feels like he is easily 50% better, then others he has just as much pain Goal status: IN PROGRESS   2.  Pt will be ind with HEP and understand how to safely progress. Baseline: Patient working on progressing Goal status: IN PROGRESS   3.  Improve FOTO score to at least 89% to demo improved function. Baseline: 78% - improved to 85% Goal status: IN PROGRESS   4.     PLAN: PT FREQUENCY: 1-2x/week   PT DURATION: 12 weeks   PLANNED INTERVENTIONS: Therapeutic exercises, Therapeutic activity, Neuromuscular re-education, Balance training, Patient/Family education, Joint mobilization, Dry Needling, Electrical stimulation, Spinal mobilization, Cryotherapy, Moist heat, Biofeedback, and Manual therapy   PLAN FOR NEXT SESSION: recheck need for DN to Rt glutes/hip rotators/adductors/hip flexors; core/pelvic stability exercise progression to tolerance.   Anitra Doxtater, PT 12/07/21 3:47 PM

## 2021-12-14 ENCOUNTER — Ambulatory Visit: Payer: BC Managed Care – PPO | Attending: Sports Medicine

## 2021-12-14 DIAGNOSIS — R252 Cramp and spasm: Secondary | ICD-10-CM | POA: Diagnosis present

## 2021-12-14 DIAGNOSIS — R293 Abnormal posture: Secondary | ICD-10-CM | POA: Diagnosis present

## 2021-12-14 NOTE — Therapy (Signed)
OUTPATIENT PHYSICAL THERAPY TREATMENT NOTE   Patient Name: Derrick Ellis MRN: [id number] DOB:2001/03/31, 21 y.o., male Today's Date: 12/14/2021  PCP: Cristine Polio, MD REFERRING PROVIDER: Cristine Polio, MD  END OF SESSION:   PT End of Session - 12/14/21 1445     Visit Number 12    Date for PT Re-Evaluation 02/06/22    Authorization Type BC[employer]    Authorization - Visit Number 12    Authorization - Number of Visits 30    PT Start Time 3338    PT Stop Time 1525    PT Time Calculation (min) 40 min    Activity Tolerance Patient tolerated treatment well    Behavior During Therapy [facility] for tasks assessed/performed                History reviewed. No pertinent past medical history. History reviewed. No pertinent surgical history. There are no problems to display for this patient.   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 He was sprinting 150s yesterday when pain started, but it was not as severe as in the past.   PAIN:  PAIN:  Are you having pain? No NPRS scale: 0/10, intermittent Pain location: Rt buttock/pelvic region  Pain orientation: Right  PAIN TYPE: tight Pain description:  tight   Aggravating factors: lunge, when Lt foot is forward, getting in/out of car Relieving factors: stretching     SUBJECTIVE:     PERTINENT HISTORY: Pubic symphysis injection under fluoroscopy 09/29/21 Rt ACL reconstruction 04/02/2019, BPTB Autograft      PRECAUTIONS: None   WEIGHT BEARING RESTRICTIONS No   FALLS:  Has patient fallen in last 6 months? No   LIVING ENVIRONMENT: Lives with: dorm room, single Lives in: Other dorm room at college Stairs: sometimes pain with stairs Has following equipment at home: None    OCCUPATION: student, full time   PLOF: Independent   PATIENT GOALS  Get rid of pain     OBJECTIVE 11/14/21: Squat WNL but patient reports increased Rt gluteal tightness (no loading); single leg squat with more knee instability and weight shift over Rt LE; lunge (normally reproduce pain lunging forward onto Lt at end range, but not currently) with more trunk rotation to Rt when lunging onto Rt; Rt glute strength 5/5, but tightness and pelvic pain with prolonged resistance; Rt hip adduction 4/5 with soreness and popping around Rt hip - no pelvic pain; tenderness with palpation of Rt ischial tuberosity and around obturator foramen/obturator internus in Rt sidelying; ASLR with resistance demonstrates increased core rotation with testing on Rt; *significant tenderness to palpation directly over Rt obturator internus FOTO: 85  10/24/21: Reduced and painful IR on Rt hip, no pain with resisted Rt adduction, tenderness externally to Rt OI; decreased stability with SL squat on Rt and Rt weight shift/Lt rotation with regular squat; significant tightness and tenderness throughout Rt glutes, hip flexors, and adductors; internal rectal exam demonstrated exquisite pain in Rt puborectalis and obturator internus   Objective 10/17/21:  DIAGNOSTIC FINDINGS:  Pelvic MRI 09/11/21 Pubic symphysis injection under flouroscopy 09/29/21   PATIENT SURVEYS:  FOTO 78%, goal 89%   COGNITION:           Overall cognitive status: Within functional limits for tasks assessed  SENSATION: WFL   MUSCLE LENGTH: End range hamstrings limited Rt > Lt piriformis and gluteals limited   POSTURE:  scoliosis   PALPATION: Signif local tender to palpation inside Rt ischial tuberosity, pelvic floor TP present in Rt glut min, glut med, piriformis   LE ROM:   Active ROM Right 10/17/2021 Left 10/17/2021  Hip flexion      Hip extension      Hip abduction      Hip adduction      Hip internal rotation       Hip external rotation 50 55  Knee flexion      Knee extension      Ankle dorsiflexion      Ankle plantarflexion      Ankle inversion      Ankle eversion       (Blank rows = not tested)   LE MMT:   5/5 with with recreation of sharp pelvic pain on testing Rt hip abduction   LOWER EXTREMITY SPECIAL TESTS:  Hip special tests: Saralyn Pilar (FABER) test: negative SI joints unremarkable   FUNCTIONAL TESTS:  External palpation of pelvic floor lift and bulge: min lift, lacks bulge .  Pt denies difficulty with defecation or pain with this. Active SLR: lacks pelvic control with Lt LE lift     GAIT: WNL      TODAY'S TREATMENT 12/14/21 Manual: Soft tissue mobilization: Rt posterolateral hip mm Rt adductors/medial hamstrings Myofascial release: Proximal Rt adductor attachments with utilization of massage tool Borders of Rt obturator internus Dry needling: Trigger Point Dry-Needling  Treatment instructions: Expect mild to moderate muscle soreness. S/S of pneumothorax if dry needled over a lung field, and to seek immediate medical attention should they occur. Patient verbalized understanding of these instructions and education.  Patient Consent Given: Yes Education handout provided: Previously provided Muscles treated: glutes, TFL, piriformis Electrical stimulation performed: No Parameters: N/A Treatment response/outcome: twitch response/muscle release Neuro-muscular Re-ed: Single leg rows with rotation, 10x Rt, Blue band Single leg RDLs on 6 inch step with rotation, 10x Rt    TREATMENT 12/07/21   Passive dynamic stretching of Rt adductors into flexion/ER   STM to adductors, adductor magnus, adductor tendons   Active release Rt adductors with fist pressure into medial belly with contract/relax 5x5" in Rt  SL OI external TP release Trigger Point Dry-Needling  Treatment instructions: Expect mild to moderate muscle soreness. S/S of pneumothorax if dry needled over a lung field, and to  seek immediate medical attention should they occur. Patient verbalized understanding of these instructions and education.  Patient Consent Given: Yes Education handout provided: Previously provided Muscles treated: glut medius on Rt Electrical stimulation performed: No Parameters: N/A Treatment response/outcome: signif twitch and release today  Standing contract/relax at end range Rt hamstring and adductor stretch foot on chair 5x5" each      TODAY'S TREATMENT 11/30/21  Trigger Point Dry-Needling  Treatment instructions: Expect mild to moderate muscle soreness. S/S of pneumothorax if dry needled over a lung field, and to seek immediate medical attention should they occur. Patient verbalized understanding of these instructions and education.  Patient Consent Given: Yes Education handout provided: Previously provided Muscles treated: Rt glut med and piriformis Electrical stimulation performed: No Parameters: N/A Treatment response/outcome: deep ache reported with DN  Manual: Soft tissue mobilization: Rt adductors, medial hamstrings, adductor magnus, OI    PATIENT EDUCATION:  Education details: Access Code: [email or web address] Person educated: Patient Education method: Explanation, Demonstration, and Handouts Education comprehension: verbalized understanding and returned  demonstration     HOME EXERCISE PROGRAM: Access Code: [email or web address] URL: [email or web address] Date: 10/17/2021 Prepared by: Venetia Night Beuhring   Exercises - Pigeon Pose  - 1 x daily - 7 x weekly - 1 sets - 3 reps - 30 hold - Quadruped Rocking Backward  - 1 x daily - 7 x weekly - 1 sets - 15 reps   ASSESSMENT:   CLINICAL IMPRESSION: Pt does appear to be making clinical improvements due to decrease in pain severity and frequency. However, when performing manual techniques ,he presents with significant soreness and restriction throughout Rt adductors/obturator internus/glutes/piriformis. These muscle groups  continue to respond well to manual techniques with notable improvements in restriction and less pain throughout treatment. He does have one band of severe pain with compression along posterior adductors as they meet medial hamstring that he did not report improvement in pain with during treatment. To help improve mobility, circulation and strength to proximal hamstring/adductor attachments in addition to pelvic floor, single leg activities with rotation added to exercises with good tolerance. Continue along POC with ongoing assessment of response to treatment interventions.      OBJECTIVE IMPAIRMENTS decreased ROM, increased muscle spasms, impaired flexibility, impaired tone, and pain.    ACTIVITY LIMITATIONS  sport .    PERSONAL FACTORS Time since onset of injury/illness/exacerbation are also affecting patient's functional outcome.      REHAB POTENTIAL: Excellent   CLINICAL DECISION MAKING: Stable/uncomplicated   EVALUATION COMPLEXITY: Low     GOALS: Goals reviewed with patient? Yes   SHORT TERM GOALS: Target date: 10/31/2021   Pt will be able to understand source of pelvic pain and learn tools to address dysfunction. Baseline: Goal status: MET        LONG TERM GOALS: Target date: 11/14/2021   Pt will report improved pelvic pain by at least 50% with exercise. Baseline: patient states that it depends - some days he feels like he is easily 50% better, then others he has just as much pain Goal status: IN PROGRESS   2.  Pt will be ind with HEP and understand how to safely progress. Baseline: Patient working on progressing Goal status: IN PROGRESS   3.  Improve FOTO score to at least 89% to demo improved function. Baseline: 78% - improved to 85% Goal status: IN PROGRESS   4.     PLAN: PT FREQUENCY: 1-2x/week   PT DURATION: 12 weeks   PLANNED INTERVENTIONS: Therapeutic exercises, Therapeutic activity, Neuromuscular re-education, Balance training, Patient/Family education,  Joint mobilization, Dry Needling, Electrical stimulation, Spinal mobilization, Cryotherapy, Moist heat, Biofeedback, and Manual therapy   PLAN FOR NEXT SESSION: recheck need for DN to Rt glutes/hip rotators/adductors/hip flexors; core/pelvic stability exercise progression to tolerance.   Heather Roberts, PT, DPT06/01/233:32 PM

## 2021-12-21 ENCOUNTER — Ambulatory Visit: Payer: BC Managed Care – PPO

## 2021-12-21 DIAGNOSIS — R293 Abnormal posture: Secondary | ICD-10-CM

## 2021-12-21 DIAGNOSIS — R252 Cramp and spasm: Secondary | ICD-10-CM | POA: Diagnosis not present

## 2021-12-21 NOTE — Therapy (Signed)
OUTPATIENT PHYSICAL THERAPY TREATMENT NOTE   Patient Name: Derrick Ellis MRN: [id number] DOB:[birth year], 21 y.o., male Today's Date: 12/21/2021  PCP: Cristine Polio, MD REFERRING PROVIDER: Berle Mull, MD  END OF SESSION:   PT End of Session - 12/21/21 1448     Visit Number 13    Date for PT Re-Evaluation 02/06/22    Authorization Type [employer]    Authorization - Visit Number 13    Authorization - Number of Visits 30    PT Start Time 4801    PT Stop Time 1525    PT Time Calculation (min) 40 min    Activity Tolerance Patient tolerated treatment well    Behavior During Therapy [facility] for tasks assessed/performed                 History reviewed. No pertinent past medical history. History reviewed. No pertinent surgical history. There are no problems to display for this patient.   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 Pain lasted just  through the movement and he can't replicate. He currently reports 0/10 pain. He reports a little residual soreness from DN, but felt good overall. He does feel soreness surrounding Rt hip flexors/adductors/medial hamstrings that he finds hard to get with stretching. He states that he has low back pain all the time.   PAIN:  PAIN:  Are you having pain? No NPRS scale: 0/10, intermittent Pain location: Rt buttock/pelvic region  Pain orientation: Right  PAIN TYPE: tight Pain description:  tight   Aggravating factors: lunge, when Lt foot is forward, getting in/out of car Relieving factors: stretching     SUBJECTIVE:     PERTINENT HISTORY: Pubic symphysis injection under fluoroscopy 09/29/21 Rt ACL reconstruction 04/02/2019, BPTB Autograft      PRECAUTIONS: None   WEIGHT BEARING  RESTRICTIONS No   FALLS:  Has patient fallen in last 6 months? No   LIVING ENVIRONMENT: Lives with: dorm room, single Lives in: Other dorm room at college Stairs: sometimes pain with stairs Has following equipment at home: None   OCCUPATION: student, full time   PLOF: Independent   PATIENT GOALS  Get rid of pain     OBJECTIVE 11/14/21: Squat WNL but patient reports increased Rt gluteal tightness (no loading); single leg squat with more knee instability and weight shift over Rt LE; lunge (normally reproduce pain lunging forward onto Lt at end range, but not currently) with more trunk rotation to Rt when lunging onto Rt; Rt glute strength 5/5, but tightness and pelvic pain with prolonged resistance; Rt hip adduction 4/5 with soreness and popping around Rt hip - no pelvic pain; tenderness with palpation of Rt ischial tuberosity and around obturator foramen/obturator internus in Rt sidelying; ASLR with resistance demonstrates increased core rotation with testing on Rt; *significant tenderness to palpation directly over Rt obturator internus FOTO: 85  10/24/21: Reduced and painful IR on Rt hip, no pain with resisted Rt adduction, tenderness externally to Rt OI; decreased stability with SL squat on Rt and Rt weight shift/Lt rotation with regular squat; significant tightness and tenderness throughout Rt glutes, hip flexors, and adductors; internal rectal exam demonstrated exquisite pain in Rt puborectalis and obturator internus   Objective 10/17/21:  DIAGNOSTIC FINDINGS:  Pelvic  MRI 09/11/21 Pubic symphysis injection under flouroscopy 09/29/21   PATIENT SURVEYS:  FOTO 78%, goal 89%   COGNITION:           Overall cognitive status: Within functional limits for tasks assessed                          SENSATION: WFL   MUSCLE LENGTH: End range hamstrings limited Rt > Lt piriformis and gluteals limited   POSTURE:  scoliosis   PALPATION: Signif local tender to palpation inside Rt ischial  tuberosity, pelvic floor TP present in Rt glut min, glut med, piriformis   LE ROM:   Active ROM Right 10/17/2021 Left 10/17/2021  Hip flexion      Hip extension      Hip abduction      Hip adduction      Hip internal rotation      Hip external rotation 50 55  Knee flexion      Knee extension      Ankle dorsiflexion      Ankle plantarflexion      Ankle inversion      Ankle eversion       (Blank rows = not tested)   LE MMT:   5/5 with with recreation of sharp pelvic pain on testing Rt hip abduction   LOWER EXTREMITY SPECIAL TESTS:  Hip special tests: Saralyn Pilar (FABER) test: negative SI joints unremarkable   FUNCTIONAL TESTS:  External palpation of pelvic floor lift and bulge: min lift, lacks bulge .  Pt denies difficulty with defecation or pain with this. Active SLR: lacks pelvic control with Lt LE lift     GAIT: WNL      TODAY'S TREATMENT 12/21/21 Manual: Manual: Soft tissue mobilization: Rt posterolateral hip mm Rt adductors/medial hamstrings Myofascial release: Proximal Rt adductor attachments with utilization of massage tool Borders of Rt obturator internus Dry needling: Trigger Point Dry-Needling  Treatment instructions: Expect mild to moderate muscle soreness. S/S of pneumothorax if dry needled over a lung field, and to seek immediate medical attention should they occur. Patient verbalized understanding of these instructions and education.  Patient Consent Given: Yes Education handout provided: Previously provided Muscles treated: glutes, TFL, piriformis Electrical stimulation performed: No Parameters: N/A Treatment response/outcome: twitch response/muscle release   TREATMENT 12/14/21 Manual: Soft tissue mobilization: Rt posterolateral hip mm Rt adductors/medial hamstrings Myofascial release: Proximal Rt adductor attachments with utilization of massage tool Borders of Rt obturator internus Dry needling: Trigger Point Dry-Needling  Treatment instructions:  Expect mild to moderate muscle soreness. S/S of pneumothorax if dry needled over a lung field, and to seek immediate medical attention should they occur. Patient verbalized understanding of these instructions and education.  Patient Consent Given: Yes Education handout provided: Previously provided Muscles treated: glutes, TFL, piriformis Electrical stimulation performed: No Parameters: N/A Treatment response/outcome: twitch response/muscle release Neuro-muscular Re-ed: Single leg rows with rotation, 10x Rt, Blue band Single leg RDLs on 6 inch step with rotation, 10x Rt    TREATMENT 12/07/21   Passive dynamic stretching of Rt adductors into flexion/ER   STM to adductors, adductor magnus, adductor tendons   Active release Rt adductors with fist pressure into medial belly with contract/relax 5x5" in Rt  SL OI external TP release Trigger Point Dry-Needling  Treatment instructions: Expect mild to moderate muscle soreness. S/S of pneumothorax if dry needled over a lung field, and to seek immediate medical attention should they occur. Patient verbalized understanding of these instructions  and education.  Patient Consent Given: Yes Education handout provided: Previously provided Muscles treated: glut medius on Rt Electrical stimulation performed: No Parameters: N/A Treatment response/outcome: signif twitch and release today  Standing contract/relax at end range Rt hamstring and adductor stretch foot on chair 5x5" each       PATIENT EDUCATION:  Education details: Access Code: [email or web address] Person educated: Patient Education method: Explanation, Demonstration, and Handouts Education comprehension: verbalized understanding and returned demonstration     HOME EXERCISE PROGRAM: Access Code: [email or web address] URL: [email or web address] Date: 10/17/2021 Prepared by: Venetia Night Beuhring   Exercises - Pigeon Pose  - 1 x daily - 7 x weekly - 1 sets - 3 reps - 30 hold - Quadruped Rocking  Backward  - 1 x daily - 7 x weekly - 1 sets - 15 reps   ASSESSMENT:   CLINICAL IMPRESSION: Patient overall doing very well and he states that he is drastically improved from when he first started PT. However, due to where he is reporting soreness wrapping around from lateral Rt hip into inner thigh, low back could be contributing factor and could use further evaluation if soreness does not improve. Good release of all restriction with manual techniques techniques performed today. He demonstrated increased restriction between vastus medialis/adductor groups today that was also tender, but responded well to instrument assisted mobilization. We discussed standing lateral lunge adductor stretch to attempt access to area we are trying to release that has been difficult to find with any of his current stretches. Continue along POC with ongoing assessment of response to treatment interventions.      OBJECTIVE IMPAIRMENTS decreased ROM, increased muscle spasms, impaired flexibility, impaired tone, and pain.    ACTIVITY LIMITATIONS  sport .    PERSONAL FACTORS Time since onset of injury/illness/exacerbation are also affecting patient's functional outcome.      REHAB POTENTIAL: Excellent   CLINICAL DECISION MAKING: Stable/uncomplicated   EVALUATION COMPLEXITY: Low     GOALS: Goals reviewed with patient? Yes   SHORT TERM GOALS: Target date: 10/31/2021   Pt will be able to understand source of pelvic pain and learn tools to address dysfunction. Baseline: Goal status: MET        LONG TERM GOALS: Target date: 11/14/2021 - updated 12/21/21   Pt will report improved pelvic pain by at least 50% with exercise. Baseline: patient working towards greater pain reduction Goal status: MET   2.  Pt will be ind with HEP and understand how to safely progress. Baseline: Patient working on progressing Goal status: MET   3.  Improve FOTO score to at least 89% to demo improved function. Baseline: 78% -  improved to 85% Goal status: IN PROGRESS        PLAN: PT FREQUENCY: 1-2x/week   PT DURATION: 12 weeks   PLANNED INTERVENTIONS: Therapeutic exercises, Therapeutic activity, Neuromuscular re-education, Balance training, Patient/Family education, Joint mobilization, Dry Needling, Electrical stimulation, Spinal mobilization, Cryotherapy, Moist heat, Biofeedback, and Manual therapy   PLAN FOR NEXT SESSION: recheck need for DN to Rt glutes/hip rotators/adductors/hip flexors; core/pelvic stability exercise progression to tolerance.   Heather Roberts, PT, DPT06/08/233:29 PM

## 2021-12-28 ENCOUNTER — Ambulatory Visit: Payer: BC Managed Care – PPO

## 2021-12-28 DIAGNOSIS — R252 Cramp and spasm: Secondary | ICD-10-CM

## 2021-12-28 DIAGNOSIS — R293 Abnormal posture: Secondary | ICD-10-CM

## 2021-12-28 NOTE — Therapy (Signed)
OUTPATIENT PHYSICAL THERAPY TREATMENT NOTE   Patient Name: Derrick Ellis MRN: [id number] DOB:March 13, 2001, 21 y.o., male Today's Date: 12/28/2021  PCP: Cristine Polio, MD REFERRING PROVIDER: Berle Mull, MD  END OF SESSION:   PT End of Session - 12/28/21 1451     Visit Number 14    Date for PT Re-Evaluation 02/06/22    Authorization Type BCBS    Authorization - Visit Number 14    Authorization - Number of Visits 30    PT Start Time 4665    PT Stop Time 1525    PT Time Calculation (min) 40 min    Activity Tolerance Patient tolerated treatment well    Behavior During Therapy [facility] for tasks assessed/performed                  History reviewed. No pertinent past medical history. History reviewed. No pertinent surgical history. There are no problems to display for this patient.   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 Monday night he had trouble sleeping due to the pain - on stomach rolling to Lt side, bringing knee up. No real pain during work outs. He was sore from DN after last session, but he was fine working out.   PAIN:  PAIN:  Are you having pain? No NPRS scale: 0/10, intermittent Pain location: Rt buttock/pelvic region  Pain orientation: Right  PAIN TYPE: tight Pain description:  tight   Aggravating factors: lunge, when Lt foot is forward, getting in/out of car Relieving factors: stretching     SUBJECTIVE:     PERTINENT HISTORY: Pubic symphysis injection under fluoroscopy 09/29/21 Rt ACL reconstruction 04/02/2019, BPTB Autograft      PRECAUTIONS: None   WEIGHT BEARING RESTRICTIONS No   FALLS:  Has patient fallen in last 6 months? No   LIVING ENVIRONMENT: Lives with: dorm room, single Lives in: Other dorm room at college Stairs:  sometimes pain with stairs Has following equipment at home: None   OCCUPATION: student, full time   PLOF: Independent   PATIENT GOALS  Get rid of pain     OBJECTIVE 11/14/21: Squat WNL but patient reports increased Rt gluteal tightness (no loading); single leg squat with more knee instability and weight shift over Rt LE; lunge (normally reproduce pain lunging forward onto Lt at end range, but not currently) with more trunk rotation to Rt when lunging onto Rt; Rt glute strength 5/5, but tightness and pelvic pain with prolonged resistance; Rt hip adduction 4/5 with soreness and popping around Rt hip - no pelvic pain; tenderness with palpation of Rt ischial tuberosity and around obturator foramen/obturator internus in Rt sidelying; ASLR with resistance demonstrates increased core rotation with testing on Rt; *significant tenderness to palpation directly over Rt obturator internus FOTO: 85  10/24/21: Reduced and painful IR on Rt hip, no pain with resisted Rt adduction, tenderness externally to Rt OI; decreased stability with SL squat on Rt and Rt weight shift/Lt rotation with regular squat; significant tightness and tenderness throughout Rt glutes, hip flexors, and adductors; internal rectal exam demonstrated exquisite pain in Rt puborectalis and obturator internus   Objective 10/17/21:  DIAGNOSTIC FINDINGS:  Pelvic MRI 09/11/21 Pubic symphysis injection under flouroscopy 09/29/21   PATIENT SURVEYS:  FOTO 78%, goal 89%   COGNITION:  Overall cognitive status: Within functional limits for tasks assessed                          SENSATION: WFL   MUSCLE LENGTH: End range hamstrings limited Rt > Lt piriformis and gluteals limited   POSTURE:  scoliosis   PALPATION: Signif local tender to palpation inside Rt ischial tuberosity, pelvic floor TP present in Rt glut min, glut med, piriformis   LE ROM:   Active ROM Right 10/17/2021 Left 10/17/2021  Hip flexion      Hip extension       Hip abduction      Hip adduction      Hip internal rotation      Hip external rotation 50 55  Knee flexion      Knee extension      Ankle dorsiflexion      Ankle plantarflexion      Ankle inversion      Ankle eversion       (Blank rows = not tested)   LE MMT:   5/5 with with recreation of sharp pelvic pain on testing Rt hip abduction   LOWER EXTREMITY SPECIAL TESTS:  Hip special tests: Saralyn Pilar (FABER) test: negative SI joints unremarkable   FUNCTIONAL TESTS:  External palpation of pelvic floor lift and bulge: min lift, lacks bulge .  Pt denies difficulty with defecation or pain with this. Active SLR: lacks pelvic control with Lt LE lift     GAIT: WNL      TODAY'S TREATMENT 12/28/21 Manual: Soft tissue mobilization: Rt adductors, obturator internus, glutes, piriformis, TFL, medial hamstrings Dry needling: Trigger Point Dry-Needling  Treatment instructions: Expect mild to moderate muscle soreness. S/S of pneumothorax if dry needled over a lung field, and to seek immediate medical attention should they occur. Patient verbalized understanding of these instructions and education.  Patient Consent Given: Yes Education handout provided: Previously provided Muscles treated: Rt glutes Electrical stimulation performed: No Parameters: N/A Treatment response/outcome: twitch response/muscle release   TREATMENT 12/21/21 Manual: Manual: Soft tissue mobilization: Rt posterolateral hip mm Rt adductors/medial hamstrings Myofascial release: Proximal Rt adductor attachments with utilization of massage tool Borders of Rt obturator internus Dry needling: Trigger Point Dry-Needling  Treatment instructions: Expect mild to moderate muscle soreness. S/S of pneumothorax if dry needled over a lung field, and to seek immediate medical attention should they occur. Patient verbalized understanding of these instructions and education.  Patient Consent Given: Yes Education handout provided:  Previously provided Muscles treated: glutes, TFL, piriformis Electrical stimulation performed: No Parameters: N/A Treatment response/outcome: twitch response/muscle release   TREATMENT 12/14/21 Manual: Soft tissue mobilization: Rt posterolateral hip mm Rt adductors/medial hamstrings Myofascial release: Proximal Rt adductor attachments with utilization of massage tool Borders of Rt obturator internus Dry needling: Trigger Point Dry-Needling  Treatment instructions: Expect mild to moderate muscle soreness. S/S of pneumothorax if dry needled over a lung field, and to seek immediate medical attention should they occur. Patient verbalized understanding of these instructions and education.  Patient Consent Given: Yes Education handout provided: Previously provided Muscles treated: glutes, TFL, piriformis Electrical stimulation performed: No Parameters: N/A Treatment response/outcome: twitch response/muscle release Neuro-muscular Re-ed: Single leg rows with rotation, 10x Rt, Blue band Single leg RDLs on 6 inch step with rotation, 10x Rt     HOME EXERCISE PROGRAM: Access Code: [email or web address] URL: [email or web address] Date: 10/17/2021 Prepared by: Baruch Merl   Exercises - Pigeon Pose  - 1 x daily - 7  x weekly - 1 sets - 3 reps - 30 hold - Quadruped Rocking Backward  - 1 x daily - 7 x weekly - 1 sets - 15 reps   ASSESSMENT:   CLINICAL IMPRESSION: Continued DN to Rt posterolateral hip muscles with significant twitch response. Significant restriction still present in Rt adductors/medial hamstrings with tool assist. More tenderness in posterior Rt obturator internus today with palpable trigger point - pt still declines DN to Rt adductors and obturator internus again. We discussed possibly trying to self massage this area with golf ball instead of lacrosse ball to help pinpoint trigger point - he still does not want to consider pelvic floor wand at this time. Overall  patient is seeing progress with no reproduction of pain during work outs of all intensities. Continue along POC with ongoing assessment of response to treatment interventions.      OBJECTIVE IMPAIRMENTS decreased ROM, increased muscle spasms, impaired flexibility, impaired tone, and pain.    ACTIVITY LIMITATIONS  sport .    PERSONAL FACTORS Time since onset of injury/illness/exacerbation are also affecting patient's functional outcome.      REHAB POTENTIAL: Excellent   CLINICAL DECISION MAKING: Stable/uncomplicated   EVALUATION COMPLEXITY: Low     GOALS: Goals reviewed with patient? Yes   SHORT TERM GOALS: Target date: 10/31/2021 - updated 12/21/21   Pt will be able to understand source of pelvic pain and learn tools to address dysfunction. Baseline: Goal status: MET        LONG TERM GOALS: Target date: 11/14/2021 - updated 12/21/21   Pt will report improved pelvic pain by at least 50% with exercise. Baseline: patient working towards greater pain reduction Goal status: MET   2.  Pt will be ind with HEP and understand how to safely progress. Baseline: Patient working on progressing Goal status: MET   3.  Improve FOTO score to at least 89% to demo improved function. Baseline: 78% - improved to 85% Goal status: IN PROGRESS        PLAN: PT FREQUENCY: 1-2x/week   PT DURATION: 12 weeks   PLANNED INTERVENTIONS: Therapeutic exercises, Therapeutic activity, Neuromuscular re-education, Balance training, Patient/Family education, Joint mobilization, Dry Needling, Electrical stimulation, Spinal mobilization, Cryotherapy, Moist heat, Biofeedback, and Manual therapy   PLAN FOR NEXT SESSION: recheck need for DN to Rt glutes/hip rotators/adductors/hip flexors; core/pelvic stability exercise progression to tolerance.   Heather Roberts, PT, DPT06/15/233:28 PM

## 2022-01-11 ENCOUNTER — Ambulatory Visit: Payer: BC Managed Care – PPO

## 2022-01-11 DIAGNOSIS — R252 Cramp and spasm: Secondary | ICD-10-CM

## 2022-01-11 DIAGNOSIS — R293 Abnormal posture: Secondary | ICD-10-CM

## 2022-01-11 NOTE — Therapy (Signed)
OUTPATIENT PHYSICAL THERAPY TREATMENT NOTE   Patient Name: Derrick Ellis MRN: [id number] DOB:12/22/2000, 21 y.o., male Today's Date: 01/11/2022  PCP: Cristine Polio, MD REFERRING PROVIDER: Berle Mull, MD  END OF SESSION:   PT End of Session - 01/11/22 1447     Visit Number 15    Date for PT Re-Evaluation 02/06/22    Authorization Type BCBS    Authorization - Visit Number 15    Authorization - Number of Visits 30    PT Start Time 7353    PT Stop Time 1525    PT Time Calculation (min) 40 min    Activity Tolerance Patient tolerated treatment well    Behavior During Therapy [facility] for tasks assessed/performed                   History reviewed. No pertinent past medical history. History reviewed. No pertinent surgical history. There are no problems to display for this patient.   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 He had done two work outs that day. He states that towards the end of Monday's second work out, he started to have some pain, but not much during work out. He did a heavy leg day today without any pain. He states that over the last two weeks, that was really the only time he's had pain.   PAIN:  PAIN:  Are you having pain? No NPRS scale: 0/10, intermittent Pain location: Rt buttock/pelvic region  Pain orientation: Right  PAIN TYPE: tight Pain description:  tight   Aggravating factors: lunge, when Lt foot is forward, getting in/out of car Relieving factors: stretching     SUBJECTIVE:     PERTINENT HISTORY: Pubic symphysis injection under fluoroscopy 09/29/21 Rt ACL reconstruction 04/02/2019, BPTB Autograft      PRECAUTIONS: None   WEIGHT BEARING RESTRICTIONS  No   FALLS:  Has patient fallen in last 6 months? No   LIVING ENVIRONMENT: Lives with: dorm room, single Lives in: Other dorm room at college Stairs: sometimes pain with stairs Has following equipment at home: None   OCCUPATION: student, full time   PLOF: Independent   PATIENT GOALS  Get rid of pain     OBJECTIVE 11/14/21: Squat WNL but patient reports increased Rt gluteal tightness (no loading); single leg squat with more knee instability and weight shift over Rt LE; lunge (normally reproduce pain lunging forward onto Lt at end range, but not currently) with more trunk rotation to Rt when lunging onto Rt; Rt glute strength 5/5, but tightness and pelvic pain with prolonged resistance; Rt hip adduction 4/5 with soreness and popping around Rt hip - no pelvic pain; tenderness with palpation of Rt ischial tuberosity and around obturator foramen/obturator internus in Rt sidelying; ASLR with resistance demonstrates increased core rotation with testing on Rt; *significant tenderness to palpation directly over Rt obturator internus FOTO: 85  10/24/21: Reduced and painful IR on Rt hip, no pain with resisted Rt adduction, tenderness externally to Rt OI; decreased stability with SL squat on Rt and Rt weight shift/Lt rotation with regular squat; significant tightness and tenderness throughout Rt glutes, hip flexors, and adductors; internal rectal exam demonstrated exquisite pain in Rt  puborectalis and obturator internus   Objective 10/17/21:  DIAGNOSTIC FINDINGS:  Pelvic MRI 09/11/21 Pubic symphysis injection under flouroscopy 09/29/21   PATIENT SURVEYS:  FOTO 78%, goal 89%   COGNITION:           Overall cognitive status: Within functional limits for tasks assessed                          SENSATION: WFL   MUSCLE LENGTH: End range hamstrings limited Rt > Lt piriformis and gluteals limited   POSTURE:  scoliosis   PALPATION: Signif local tender to palpation inside Rt ischial tuberosity,  pelvic floor TP present in Rt glut min, glut med, piriformis   LE ROM:   Active ROM Right 10/17/2021 Left 10/17/2021  Hip flexion      Hip extension      Hip abduction      Hip adduction      Hip internal rotation      Hip external rotation 50 55  Knee flexion      Knee extension      Ankle dorsiflexion      Ankle plantarflexion      Ankle inversion      Ankle eversion       (Blank rows = not tested)   LE MMT:   5/5 with with recreation of sharp pelvic pain on testing Rt hip abduction   LOWER EXTREMITY SPECIAL TESTS:  Hip special tests: Saralyn Pilar (FABER) test: negative SI joints unremarkable   FUNCTIONAL TESTS:  External palpation of pelvic floor lift and bulge: min lift, lacks bulge .  Pt denies difficulty with defecation or pain with this. Active SLR: lacks pelvic control with Lt LE lift     GAIT: WNL      TODAY'S TREATMENT 01/11/22 Manual: Soft tissue mobilization: Bil posterolateral hip mm Rt adductors/medial hamstrings/obturator internus Myofascial release: Proximal Rt adductor attachments with utilization of massage tool Borders of Rt obturator internus Dry needling: Trigger Point Dry-Needling  Treatment instructions: Expect mild to moderate muscle soreness. S/S of pneumothorax if dry needled over a lung field, and to seek immediate medical attention should they occur. Patient verbalized understanding of these instructions and education.  Patient Consent Given: Yes Education handout provided: Previously provided Muscles treated: Bil glutes, TFL, piriformis Electrical stimulation performed: No Parameters: N/A Treatment response/outcome: twitch response/muscle release    TREATMENT 12/28/21 Manual: Soft tissue mobilization: Rt adductors, obturator internus, glutes, piriformis, TFL, medial hamstrings Dry needling: Trigger Point Dry-Needling  Treatment instructions: Expect mild to moderate muscle soreness. S/S of pneumothorax if dry needled over a lung field,  and to seek immediate medical attention should they occur. Patient verbalized understanding of these instructions and education.  Patient Consent Given: Yes Education handout provided: Previously provided Muscles treated: Rt glutes Electrical stimulation performed: No Parameters: N/A Treatment response/outcome: twitch response/muscle release   TREATMENT 12/21/21 Manual: Manual: Soft tissue mobilization: Rt posterolateral hip mm Rt adductors/medial hamstrings Myofascial release: Proximal Rt adductor attachments with utilization of massage tool Borders of Rt obturator internus Dry needling: Trigger Point Dry-Needling  Treatment instructions: Expect mild to moderate muscle soreness. S/S of pneumothorax if dry needled over a lung field, and to seek immediate medical attention should they occur. Patient verbalized understanding of these instructions and education.  Patient Consent Given: Yes Education handout provided: Previously provided Muscles treated: glutes, TFL, piriformis Electrical stimulation performed: No Parameters: N/A Treatment response/outcome: twitch response/muscle release    HOME EXERCISE PROGRAM: Access  Code: [email or web address]    ASSESSMENT:   CLINICAL IMPRESSION: Discussed with pt that despite one day of pain exacerbation after two work outs in a row that focused on exercises that typically exacerbate pain may still indicate improvement, especially without PT appointment last week. Significant twitch response to DN in Bil glutes, but unable to get desired response in Rt obturator - only one attempt due to patient's comfort level. As fascial restriction surrounding obturator loosened, we were able to get into muscle belly and release good restriction. We will continue working evaluation of condition. Continue along POC with ongoing assessment of response to treatment interventions.      OBJECTIVE IMPAIRMENTS decreased ROM, increased muscle spasms, impaired flexibility,  impaired tone, and pain.    ACTIVITY LIMITATIONS  sport .    PERSONAL FACTORS Time since onset of injury/illness/exacerbation are also affecting patient's functional outcome.      REHAB POTENTIAL: Excellent   CLINICAL DECISION MAKING: Stable/uncomplicated   EVALUATION COMPLEXITY: Low     GOALS: Goals reviewed with patient? Yes   SHORT TERM GOALS: Target date: 10/31/2021 - updated 12/21/21   Pt will be able to understand source of pelvic pain and learn tools to address dysfunction. Baseline: Goal status: MET        LONG TERM GOALS: Target date: 11/14/2021 - updated 12/21/21   Pt will report improved pelvic pain by at least 50% with exercise. Baseline: patient working towards greater pain reduction Goal status: MET   2.  Pt will be ind with HEP and understand how to safely progress. Baseline: Patient working on progressing Goal status: MET   3.  Improve FOTO score to at least 89% to demo improved function. Baseline: 78% - improved to 85% Goal status: IN PROGRESS        PLAN: PT FREQUENCY: 1-2x/week   PT DURATION: 12 weeks   PLANNED INTERVENTIONS: Therapeutic exercises, Therapeutic activity, Neuromuscular re-education, Balance training, Patient/Family education, Joint mobilization, Dry Needling, Electrical stimulation, Spinal mobilization, Cryotherapy, Moist heat, Biofeedback, and Manual therapy   PLAN FOR NEXT SESSION: DN and manual techniques; focus on external release of obturator internus; consider lateral hip mobilizations with strap.   Heather Roberts, PT, DPT06/29/233:32 PM

## 2022-01-18 ENCOUNTER — Ambulatory Visit: Payer: BC Managed Care – PPO | Attending: Sports Medicine

## 2022-01-18 DIAGNOSIS — R252 Cramp and spasm: Secondary | ICD-10-CM | POA: Diagnosis not present

## 2022-01-18 DIAGNOSIS — R293 Abnormal posture: Secondary | ICD-10-CM | POA: Insufficient documentation

## 2022-01-18 NOTE — Therapy (Signed)
OUTPATIENT PHYSICAL THERAPY TREATMENT NOTE   Patient Name: Derrick Ellis MRN: [id number] DOB:2000-08-25, 21 y.o., male Today's Date: 01/18/2022  PCP: Cristine Polio, MD REFERRING PROVIDER: Berle Mull, MD  END OF SESSION:   PT End of Session - 01/18/22 1501     Visit Number 16    Date for PT Re-Evaluation 02/06/22    Authorization Type BCBS    Authorization - Visit Number 16    Authorization - Number of Visits 30    PT Start Time 2863    PT Stop Time 1528    PT Time Calculation (min) 43 min    Activity Tolerance Patient tolerated treatment well    Behavior During Therapy [facility] for tasks assessed/performed                    History reviewed. No pertinent past medical history. History reviewed. No pertinent surgical history. There are no problems to display for this patient.   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 He had a little bit Saturday morning when he was performing exercises. He states that it was minimal pain and he did stretches right after work out and it was find. He states that he had moments today when he had some very minor pain. He states that low back is extremely tight.   PAIN:  PAIN:  Are you having pain? No NPRS scale: 0/10, intermittent Pain location: Rt buttock/pelvic region  Pain orientation: Right  PAIN TYPE: tight Pain description:  tight   Aggravating factors: lunge, when Lt foot is forward, getting in/out of car Relieving factors: stretching     SUBJECTIVE:     PERTINENT HISTORY: Pubic symphysis injection under fluoroscopy 09/29/21 Rt ACL reconstruction 04/02/2019, BPTB Autograft      PRECAUTIONS: None   WEIGHT BEARING RESTRICTIONS No   FALLS:  Has patient fallen in last 6 months? No   LIVING ENVIRONMENT: Lives with: dorm room,  single Lives in: Other dorm room at college Stairs: sometimes pain with stairs Has following equipment at home: None   OCCUPATION: student, full time   PLOF: Independent   PATIENT GOALS  Get rid of pain     OBJECTIVE 11/14/21: Squat WNL but patient reports increased Rt gluteal tightness (no loading); single leg squat with more knee instability and weight shift over Rt LE; lunge (normally reproduce pain lunging forward onto Lt at end range, but not currently) with more trunk rotation to Rt when lunging onto Rt; Rt glute strength 5/5, but tightness and pelvic pain with prolonged resistance; Rt hip adduction 4/5 with soreness and popping around Rt hip - no pelvic pain; tenderness with palpation of Rt ischial tuberosity and around obturator foramen/obturator internus in Rt sidelying; ASLR with resistance demonstrates increased core rotation with testing on Rt; *significant tenderness to palpation directly over Rt obturator internus FOTO: 85  10/24/21: Reduced and painful IR on Rt hip, no pain with resisted Rt adduction, tenderness externally to Rt OI; decreased stability with SL squat on Rt and Rt weight shift/Lt rotation with regular squat; significant tightness and tenderness throughout Rt glutes, hip flexors, and adductors; internal rectal exam demonstrated exquisite pain in Rt puborectalis and obturator internus   Objective 10/17/21:  DIAGNOSTIC FINDINGS:  Pelvic MRI 09/11/21 Pubic symphysis injection under flouroscopy 09/29/21   PATIENT SURVEYS:  FOTO 78%, goal 89%   COGNITION:           Overall cognitive status: Within functional limits for tasks assessed                          SENSATION: WFL   MUSCLE LENGTH: End range hamstrings limited Rt > Lt piriformis and gluteals limited   POSTURE:  scoliosis   PALPATION: Signif local tender to palpation inside Rt ischial tuberosity, pelvic floor TP present in Rt glut min, glut med, piriformis   LE ROM:   Active ROM Right 10/17/2021  Left 10/17/2021  Hip flexion      Hip extension      Hip abduction      Hip adduction      Hip internal rotation      Hip external rotation 50 55  Knee flexion      Knee extension      Ankle dorsiflexion      Ankle plantarflexion      Ankle inversion      Ankle eversion       (Blank rows = not tested)   LE MMT:   5/5 with with recreation of sharp pelvic pain on testing Rt hip abduction   LOWER EXTREMITY SPECIAL TESTS:  Hip special tests: Saralyn Pilar (FABER) test: negative SI joints unremarkable   FUNCTIONAL TESTS:  External palpation of pelvic floor lift and bulge: min lift, lacks bulge .  Pt denies difficulty with defecation or pain with this. Active SLR: lacks pelvic control with Lt LE lift     GAIT: WNL      TODAY'S TREATMENT 01/18/22 Manual: Soft tissue mobilization: Bil posterolateral hip mm Rt adductors/medial hamstrings/obturator internus Myofascial release: Proximal Rt adductor attachments with utilization of massage tool Borders of Rt obturator internus Bil lumbar paraspinals with myofascial tool Dry needling: Trigger Point Dry-Needling  Treatment instructions: Expect mild to moderate muscle soreness. S/S of pneumothorax if dry needled over a lung field, and to seek immediate medical attention should they occur. Patient verbalized understanding of these instructions and education.  Patient Consent Given: Yes Education handout provided: Previously provided Muscles treated: Bil glutes, TFL, piriformis Electrical stimulation performed: No Parameters: N/A Treatment response/outcome: twitch response/muscle release   TREATMENT 01/11/22 Manual: Soft tissue mobilization: Bil posterolateral hip mm Rt adductors/medial hamstrings/obturator internus Myofascial release: Proximal Rt adductor attachments with utilization of massage tool Borders of Rt obturator internus Dry needling: Trigger Point Dry-Needling  Treatment instructions: Expect mild to moderate muscle  soreness. S/S of pneumothorax if dry needled over a lung field, and to seek immediate medical attention should they occur. Patient verbalized understanding of these instructions and education.  Patient Consent Given: Yes Education handout provided: Previously provided Muscles treated: Bil glutes, TFL, piriformis Electrical stimulation performed: No Parameters: N/A Treatment response/outcome: twitch response/muscle release    TREATMENT 12/28/21 Manual: Soft tissue mobilization: Rt adductors, obturator internus, glutes, piriformis, TFL, medial hamstrings Dry needling: Trigger Point Dry-Needling  Treatment instructions: Expect mild to moderate muscle soreness. S/S of pneumothorax if dry needled over a lung field, and to seek immediate medical attention should they occur. Patient verbalized understanding of these instructions and education.  Patient Consent Given: Yes Education handout provided: Previously provided Muscles treated: Rt glutes Electrical stimulation performed: No Parameters: N/A Treatment response/outcome: twitch response/muscle release   HOME EXERCISE PROGRAM: Access Code: [email or web address]    ASSESSMENT:   CLINICAL IMPRESSION: Pt continuing to make progress with overall low and infrequent  pain. Aggravating activities are high level work outs, especially when he performs multiple work outs in the same day. Believe he has made good progress, but also not being in football practice right now, concerned for pain levels when school starts back up. May be fine since he has continued with advanced work outs on his own. Good tolerance to all manual techniques and DN performed today with improved soft tissue restriction. He reported good increase in low back mobility and some improvement in sensation of pressure. We will continue working evaluation of condition. Continue along POC with ongoing assessment of response to treatment interventions.      OBJECTIVE IMPAIRMENTS decreased ROM,  increased muscle spasms, impaired flexibility, impaired tone, and pain.    ACTIVITY LIMITATIONS  sport .    PERSONAL FACTORS Time since onset of injury/illness/exacerbation are also affecting patient's functional outcome.      REHAB POTENTIAL: Excellent   CLINICAL DECISION MAKING: Stable/uncomplicated   EVALUATION COMPLEXITY: Low     GOALS: Goals reviewed with patient? Yes   SHORT TERM GOALS: Target date: 10/31/2021 - updated 12/21/21   Pt will be able to understand source of pelvic pain and learn tools to address dysfunction. Baseline: Goal status: MET 12/21/21        LONG TERM GOALS: Target date: 11/14/2021 - updated 12/21/21   Pt will report improved pelvic pain by at least 50% with exercise. Baseline: patient working towards greater pain reduction Goal status: MET 12/21/21   2.  Pt will be ind with HEP and understand how to safely progress. Baseline: Patient working on progressing Goal status: MET - 12/21/21   3.  Improve FOTO score to at least 89% to demo improved function. Baseline: 78% - improved to 85% Goal status: IN PROGRESS        PLAN: PT FREQUENCY: 1-2x/week   PT DURATION: 12 weeks   PLANNED INTERVENTIONS: Therapeutic exercises, Therapeutic activity, Neuromuscular re-education, Balance training, Patient/Family education, Joint mobilization, Dry Needling, Electrical stimulation, Spinal mobilization, Cryotherapy, Moist heat, Biofeedback, and Manual therapy   PLAN FOR NEXT SESSION: DN and manual techniques; focus on external release of obturator internus; consider lateral hip mobilizations with strap.   Heather Roberts, PT, DPT07/06/233:29 PM

## 2022-01-25 ENCOUNTER — Ambulatory Visit: Payer: BC Managed Care – PPO

## 2022-01-25 DIAGNOSIS — R293 Abnormal posture: Secondary | ICD-10-CM

## 2022-01-25 DIAGNOSIS — R252 Cramp and spasm: Secondary | ICD-10-CM

## 2022-01-25 NOTE — Therapy (Signed)
OUTPATIENT PHYSICAL THERAPY TREATMENT NOTE   Patient Name: Derrick Ellis MRN: [id number] DOB:2001-05-12, 21 y.o., male Today's Date: 01/25/2022  PCP: Cristine Polio, MD REFERRING PROVIDER: Berle Mull, MD  END OF SESSION:   PT End of Session - 01/25/22 1446     Visit Number 17    Date for PT Re-Evaluation 02/06/22    Authorization Type [employer]    Authorization - Visit Number 17    Authorization - Number of Visits 30    PT Start Time 8338    PT Stop Time 1525    PT Time Calculation (min) 40 min    Activity Tolerance Patient tolerated treatment well    Behavior During Therapy [facility] for tasks assessed/performed                     History reviewed. No pertinent past medical history. History reviewed. No pertinent surgical history. There are no problems to display for this patient.   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 He states that he has night had much pain at night when trying to roll in bed. He had very small amount of pain at end of work out this morning, but nothing during any other work outs this week. He states that he is still having a lot of low back/glute tightness. He was able to incorporate nerve glides and feels like it helps sometimes  PAIN:  PAIN:  Are you having pain? No NPRS scale: 0/10, intermittent Pain location: Rt buttock/pelvic region  Pain orientation: Right  PAIN TYPE: tight Pain description:  tight   Aggravating factors: lunge, when Lt foot is forward, getting in/out of car Relieving factors: stretching     SUBJECTIVE:     PERTINENT HISTORY: Pubic symphysis injection under fluoroscopy 09/29/21 Rt ACL reconstruction 04/02/2019, BPTB Autograft      PRECAUTIONS: None   WEIGHT BEARING RESTRICTIONS  No   FALLS:  Has patient fallen in last 6 months? No   LIVING ENVIRONMENT: Lives with: dorm room, single Lives in: Other dorm room at college Stairs: sometimes pain with stairs Has following equipment at home: None   OCCUPATION: student, full time   PLOF: Independent   PATIENT GOALS  Get rid of pain     OBJECTIVE 11/14/21: Squat WNL but patient reports increased Rt gluteal tightness (no loading); single leg squat with more knee instability and weight shift over Rt LE; lunge (normally reproduce pain lunging forward onto Lt at end range, but not currently) with more trunk rotation to Rt when lunging onto Rt; Rt glute strength 5/5, but tightness and pelvic pain with prolonged resistance; Rt hip adduction 4/5 with soreness and popping around Rt hip - no pelvic pain; tenderness with palpation of Rt ischial tuberosity and around obturator foramen/obturator internus in Rt sidelying; ASLR with resistance demonstrates increased core rotation with testing on Rt; *significant tenderness to palpation directly over Rt obturator internus FOTO: 85  10/24/21: Reduced and painful IR on Rt hip, no pain with resisted Rt adduction, tenderness externally to Rt OI; decreased stability with SL squat on Rt and Rt weight shift/Lt rotation with regular squat; significant tightness and tenderness throughout Rt glutes, hip flexors, and adductors; internal rectal exam demonstrated exquisite pain in Rt puborectalis and obturator  internus   Objective 10/17/21:  DIAGNOSTIC FINDINGS:  Pelvic MRI 09/11/21 Pubic symphysis injection under flouroscopy 09/29/21   PATIENT SURVEYS:  FOTO 78%, goal 89%   COGNITION:           Overall cognitive status: Within functional limits for tasks assessed                          SENSATION: WFL   MUSCLE LENGTH: End range hamstrings limited Rt > Lt piriformis and gluteals limited   POSTURE:  scoliosis   PALPATION: Signif local tender to palpation inside Rt ischial tuberosity,  pelvic floor TP present in Rt glut min, glut med, piriformis   LE ROM:   Active ROM Right 10/17/2021 Left 10/17/2021  Hip flexion      Hip extension      Hip abduction      Hip adduction      Hip internal rotation      Hip external rotation 50 55  Knee flexion      Knee extension      Ankle dorsiflexion      Ankle plantarflexion      Ankle inversion      Ankle eversion       (Blank rows = not tested)   LE MMT:   5/5 with with recreation of sharp pelvic pain on testing Rt hip abduction   LOWER EXTREMITY SPECIAL TESTS:  Hip special tests: Saralyn Pilar (FABER) test: negative SI joints unremarkable   FUNCTIONAL TESTS:  External palpation of pelvic floor lift and bulge: min lift, lacks bulge .  Pt denies difficulty with defecation or pain with this. Active SLR: lacks pelvic control with Lt LE lift     GAIT: WNL      TODAY'S TREATMENT 01/25/22 Manual:  Soft tissue mobilization: Bil posterolateral hip mm Rt adductors/medial hamstrings/obturator internus Bil lumbar paraspinals  Myofascial release: Proximal Rt adductor attachments with utilization of massage tool Borders of Rt obturator internus Bil lumbar paraspinals with myofascial tool Dry needling: Trigger Point Dry-Needling  Treatment instructions: Expect mild to moderate muscle soreness. S/S of pneumothorax if dry needled over a lung field, and to seek immediate medical attention should they occur. Patient verbalized understanding of these instructions and education.  Patient Consent Given: Yes Education handout provided: Previously provided Muscles treated: Bil glutes, TFL, piriformis, bil lumbar paraspinals L4-5  Electrical stimulation performed: Yes Parameters: Frequency 30 Hz, intensity to comfort Treatment response/outcome: twitch response/muscle release   TREATMENT 01/18/22 Manual: Soft tissue mobilization: Bil posterolateral hip mm Rt adductors/medial hamstrings/obturator internus Myofascial  release: Proximal Rt adductor attachments with utilization of massage tool Borders of Rt obturator internus Bil lumbar paraspinals with myofascial tool Dry needling: Trigger Point Dry-Needling  Treatment instructions: Expect mild to moderate muscle soreness. S/S of pneumothorax if dry needled over a lung field, and to seek immediate medical attention should they occur. Patient verbalized understanding of these instructions and education.  Patient Consent Given: Yes Education handout provided: Previously provided Muscles treated: Bil glutes, TFL, piriformis Electrical stimulation performed: No Parameters: N/A Treatment response/outcome: twitch response/muscle release   TREATMENT 01/11/22 Manual: Soft tissue mobilization: Bil posterolateral hip mm Rt adductors/medial hamstrings/obturator internus Myofascial release: Proximal Rt adductor attachments with utilization of massage tool Borders of Rt obturator internus Dry needling: Trigger Point Dry-Needling  Treatment instructions: Expect mild to moderate muscle soreness. S/S of pneumothorax if dry needled over a lung field, and to seek immediate medical attention should they occur. Patient  verbalized understanding of these instructions and education.  Patient Consent Given: Yes Education handout provided: Previously provided Muscles treated: Bil glutes, TFL, piriformis Electrical stimulation performed: No Parameters: N/A Treatment response/outcome: twitch response/muscle release    HOME EXERCISE PROGRAM: Access Code: [email or web address]    ASSESSMENT:   CLINICAL IMPRESSION: Pt doing better this week with very low pain levels and rare reproduction of symptoms with activity, like work outs or rolling in bed. Intra-muscular e-stim utilized with DN to LB and glutes this treatment session to further help decrease pain. Discussed that consistent gluteal tightness may be due to fine motor instability in low back under load that is causing  irritation. He was encouraged to continue work on core stability exercises in addition to nerve glides to help decrease this irritation. Rt obturator internus demonstrated improvement in pain intensity and restriction today during manual techniques. Continue along POC with ongoing assessment of response to treatment interventions.      OBJECTIVE IMPAIRMENTS decreased ROM, increased muscle spasms, impaired flexibility, impaired tone, and pain.    ACTIVITY LIMITATIONS  sport .    PERSONAL FACTORS Time since onset of injury/illness/exacerbation are also affecting patient's functional outcome.      REHAB POTENTIAL: Excellent   CLINICAL DECISION MAKING: Stable/uncomplicated   EVALUATION COMPLEXITY: Low     GOALS: Goals reviewed with patient? Yes   SHORT TERM GOALS: Target date: 10/31/2021 - updated 01/25/22   Pt will be able to understand source of pelvic pain and learn tools to address dysfunction. Baseline: Goal status: MET 12/21/21        LONG TERM GOALS: Target date: 11/14/2021 - updated 12/21/21   Pt will report improved pelvic pain by at least 50% with exercise. Baseline: patient working towards greater pain reduction Goal status: MET 12/21/21   2.  Pt will be ind with HEP and understand how to safely progress. Baseline: Patient working on progressing Goal status: MET - 12/21/21   3.  Improve FOTO score to at least 89% to demo improved function. Baseline: 78% - improved to 85% Goal status: IN PROGRESS        PLAN: PT FREQUENCY: 1-2x/week   PT DURATION: 12 weeks   PLANNED INTERVENTIONS: Therapeutic exercises, Therapeutic activity, Neuromuscular re-education, Balance training, Patient/Family education, Joint mobilization, Dry Needling, Electrical stimulation, Spinal mobilization, Cryotherapy, Moist heat, Biofeedback, and Manual therapy   PLAN FOR NEXT SESSION: DN and manual techniques; focus on external release of obturator internus; consider lateral hip mobilizations with  strap.   Heather Roberts, PT, DPT07/13/233:40 PM

## 2022-02-01 ENCOUNTER — Ambulatory Visit: Payer: BC Managed Care – PPO

## 2022-02-01 DIAGNOSIS — R252 Cramp and spasm: Secondary | ICD-10-CM | POA: Diagnosis not present

## 2022-02-01 DIAGNOSIS — R293 Abnormal posture: Secondary | ICD-10-CM

## 2022-02-01 NOTE — Therapy (Signed)
OUTPATIENT PHYSICAL THERAPY TREATMENT NOTE   Patient Name: Derrick Ellis MRN: [id number] DOB:November 04, 2000, 21 y.o., male Today's Date: 02/01/2022  PCP: Cristine Polio, MD REFERRING PROVIDER: Berle Mull, MD  END OF SESSION:   PT End of Session - 02/01/22 1532     Visit Number 18    Date for PT Re-Evaluation 02/06/22    Authorization Type BCBS    PT Start Time 1445    PT Stop Time 1527    PT Time Calculation (min) 42 min    Activity Tolerance Patient tolerated treatment well    Behavior During Therapy [facility] for tasks assessed/performed                      History reviewed. No pertinent past medical history. History reviewed. No pertinent surgical history. There are no problems to display for this patient.   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 He reports minor/none of his pelvic floor pain. He states that low back is better, but still bothering him some. He was able to lift heavier with less low back pain; however, driving and hour and a half in the car he is beginning to notice more pain again.   PAIN:  PAIN:  Are you having pain? No NPRS scale: 0/10, intermittent Pain location: Rt buttock/pelvic region  Pain orientation: Right  PAIN TYPE: tight Pain description:  tight   Aggravating factors: lunge, when Lt foot is forward, getting in/out of car Relieving factors: stretching     SUBJECTIVE:     PERTINENT HISTORY: Pubic symphysis injection under fluoroscopy 09/29/21 Rt ACL reconstruction 04/02/2019, BPTB Autograft      PRECAUTIONS: None   WEIGHT BEARING RESTRICTIONS No   FALLS:  Has patient fallen in last 6 months? No   LIVING ENVIRONMENT: Lives with: dorm room, single Lives in: Other dorm room  at college Stairs: sometimes pain with stairs Has following equipment at home: None   OCCUPATION: student, full time   PLOF: Independent   PATIENT GOALS  Get rid of pain     OBJECTIVE 11/14/21: Squat WNL but patient reports increased Rt gluteal tightness (no loading); single leg squat with more knee instability and weight shift over Rt LE; lunge (normally reproduce pain lunging forward onto Lt at end range, but not currently) with more trunk rotation to Rt when lunging onto Rt; Rt glute strength 5/5, but tightness and pelvic pain with prolonged resistance; Rt hip adduction 4/5 with soreness and popping around Rt hip - no pelvic pain; tenderness with palpation of Rt ischial tuberosity and around obturator foramen/obturator internus in Rt sidelying; ASLR with resistance demonstrates increased core rotation with testing on Rt; *significant tenderness to palpation directly over Rt obturator internus FOTO: 85  10/24/21: Reduced and painful IR on Rt hip, no pain with resisted Rt adduction, tenderness externally to Rt OI; decreased stability with SL squat on Rt and Rt weight shift/Lt rotation with regular squat; significant tightness and tenderness throughout Rt glutes, hip flexors, and adductors; internal rectal exam demonstrated exquisite pain in Rt puborectalis and obturator internus   Objective 10/17/21:  DIAGNOSTIC FINDINGS:  Pelvic MRI 09/11/21 Pubic symphysis injection under flouroscopy 09/29/21   PATIENT SURVEYS:  FOTO 78%, goal 89%   COGNITION:  Overall cognitive status: Within functional limits for tasks assessed                          SENSATION: WFL   MUSCLE LENGTH: End range hamstrings limited Rt > Lt piriformis and gluteals limited   POSTURE:  scoliosis   PALPATION: Signif local tender to palpation inside Rt ischial tuberosity, pelvic floor TP present in Rt glut min, glut med, piriformis   LE ROM:   Active ROM Right 10/17/2021 Left 10/17/2021  Hip flexion       Hip extension      Hip abduction      Hip adduction      Hip internal rotation      Hip external rotation 50 55  Knee flexion      Knee extension      Ankle dorsiflexion      Ankle plantarflexion      Ankle inversion      Ankle eversion       (Blank rows = not tested)   LE MMT:   5/5 with with recreation of sharp pelvic pain on testing Rt hip abduction   LOWER EXTREMITY SPECIAL TESTS:  Hip special tests: Saralyn Pilar (FABER) test: negative SI joints unremarkable   FUNCTIONAL TESTS:  External palpation of pelvic floor lift and bulge: min lift, lacks bulge .  Pt denies difficulty with defecation or pain with this. Active SLR: lacks pelvic control with Lt LE lift     GAIT: WNL      TODAY'S TREATMENT 02/01/22 Manual: Mobilizations: Lateral hip mobs with use of mobilization strap  Soft tissue mobilization: Bil posterolateral hip mm Rt adductors/medial hamstrings/obturator internus  Myofascial release: Proximal Rt adductor attachments with utilization of massage tool Borders of Rt obturator internus Bil lumbar paraspinals with myofascial tool Dry needling: Trigger Point Dry-Needling  Treatment instructions: Expect mild to moderate muscle soreness. S/S of pneumothorax if dry needled over a lung field, and to seek immediate medical attention should they occur. Patient verbalized understanding of these instructions and education.  Patient Consent Given: Yes Education handout provided: Previously provided Muscles treated: Bil glutes, TFL, piriformis Electrical stimulation performed: No Parameters: N/A Treatment response/outcome: twitch response/muscle release   TREATMENT 01/25/22 Manual:  Soft tissue mobilization: Bil posterolateral hip mm Rt adductors/medial hamstrings/obturator internus Bil lumbar paraspinals  Myofascial release: Proximal Rt adductor attachments with utilization of massage tool Borders of Rt obturator internus Bil lumbar paraspinals with myofascial  tool Dry needling: Trigger Point Dry-Needling  Treatment instructions: Expect mild to moderate muscle soreness. S/S of pneumothorax if dry needled over a lung field, and to seek immediate medical attention should they occur. Patient verbalized understanding of these instructions and education.  Patient Consent Given: Yes Education handout provided: Previously provided Muscles treated: Bil glutes, TFL, piriformis, bil lumbar paraspinals L4-5  Electrical stimulation performed: Yes Parameters: Frequency 30 Hz, intensity to comfort Treatment response/outcome: twitch response/muscle release   TREATMENT 01/18/22 Manual: Soft tissue mobilization: Bil posterolateral hip mm Rt adductors/medial hamstrings/obturator internus Myofascial release: Proximal Rt adductor attachments with utilization of massage tool Borders of Rt obturator internus Bil lumbar paraspinals with myofascial tool Dry needling: Trigger Point Dry-Needling  Treatment instructions: Expect mild to moderate muscle soreness. S/S of pneumothorax if dry needled over a lung field, and to seek immediate medical attention should they occur. Patient verbalized understanding of these instructions and education.  Patient Consent Given: Yes Education handout provided: Previously provided Muscles treated: Bil glutes, TFL, piriformis  Electrical stimulation performed: No Parameters: N/A Treatment response/outcome: twitch response/muscle release    HOME EXERCISE PROGRAM: Access Code: [email or web address]    ASSESSMENT:   CLINICAL IMPRESSION: Pt reporting good improvements in pelvic floor/obturator pain over this last week. He is having more Lt sided posterolateral hip pain this week that is bothering him with weight lifting. Significant twitch response with DN in Lt hip muscles and further reduction with soft tissue mobilization. Rt obturator internus demonstrating improved tissue restriction with one small trigger point present that released  partially. We tried lateral hip mobilizations with mobilization strap to help reduce Bil hip and Rt pelvic floor/obturator tension; he reported no pain with this activity and we discussed different exercises he could perform with lateral band pull on his own. He Continue along POC with ongoing assessment of response to treatment interventions.      OBJECTIVE IMPAIRMENTS decreased ROM, increased muscle spasms, impaired flexibility, impaired tone, and pain.    ACTIVITY LIMITATIONS  sport .    PERSONAL FACTORS Time since onset of injury/illness/exacerbation are also affecting patient's functional outcome.      REHAB POTENTIAL: Excellent   CLINICAL DECISION MAKING: Stable/uncomplicated   EVALUATION COMPLEXITY: Low     GOALS: Goals reviewed with patient? Yes   SHORT TERM GOALS: Target date: 10/31/2021 - updated 01/25/22   Pt will be able to understand source of pelvic pain and learn tools to address dysfunction. Baseline: Goal status: MET 12/21/21        LONG TERM GOALS: Target date: 11/14/2021 - updated 02/01/22   Pt will report improved pelvic pain by at least 50% with exercise. Baseline: patient working towards greater pain reduction Goal status: MET 12/21/21   2.  Pt will be ind with HEP and understand how to safely progress. Baseline: Patient working on progressing Goal status: MET - 12/21/21   3.  Improve FOTO score to at least 89% to demo improved function. Baseline: 78% - improved to 85% Goal status: IN PROGRESS        PLAN: PT FREQUENCY: 1-2x/week   PT DURATION: 12 weeks   PLANNED INTERVENTIONS: Therapeutic exercises, Therapeutic activity, Neuromuscular re-education, Balance training, Patient/Family education, Joint mobilization, Dry Needling, Electrical stimulation, Spinal mobilization, Cryotherapy, Moist heat, Biofeedback, and Manual therapy   PLAN FOR NEXT SESSION: DN and manual techniques; focus on external release of obturator internus; re-evaluation.   Heather Roberts, PT, DPT07/20/233:34 PM

## 2022-02-08 ENCOUNTER — Ambulatory Visit: Payer: BC Managed Care – PPO

## 2022-02-08 DIAGNOSIS — R293 Abnormal posture: Secondary | ICD-10-CM

## 2022-02-08 DIAGNOSIS — R252 Cramp and spasm: Secondary | ICD-10-CM | POA: Diagnosis not present

## 2022-02-08 NOTE — Therapy (Signed)
OUTPATIENT PHYSICAL THERAPY TREATMENT NOTE   Patient Name: Derrick Ellis MRN: [id number] DOB:Sep 16, 2000, 21 y.o., male Today's Date: 02/08/2022  PCP: Cristine Polio, MD REFERRING PROVIDER: Berle Mull, MD  END OF SESSION:   PT End of Session - 02/08/22 1440     Visit Number 19    Date for PT Re-Evaluation 05/03/22    Authorization Type BCBS    Authorization - Visit Number 18    Authorization - Number of Visits 30    PT Start Time 2595    Activity Tolerance Patient tolerated treatment well    Behavior During Therapy [facility] for tasks assessed/performed                       History reviewed. No pertinent past medical history. History reviewed. No pertinent surgical history. There are no problems to display for this patient.   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 He has had some moments where he has felt pelvic pain in the last week, but states that he has been pushing work outs.   PAIN:  PAIN:  Are you having pain? No NPRS scale: 0/10, intermittent Pain location: Rt buttock/pelvic region  Pain orientation: Right  PAIN TYPE: tight Pain description:  tight   Aggravating factors: lunge, when Lt foot is forward, getting in/out of car Relieving factors: stretching     SUBJECTIVE:     PERTINENT HISTORY: Pubic symphysis injection under fluoroscopy 09/29/21 Rt ACL reconstruction 04/02/2019, BPTB Autograft      PRECAUTIONS: None   WEIGHT BEARING RESTRICTIONS No   FALLS:  Has patient fallen in last 6 months? No   LIVING ENVIRONMENT: Lives with: dorm room, single Lives in: Other dorm room at college Stairs: sometimes pain with stairs Has following equipment at home: None   OCCUPATION: student,  full time   PLOF: Independent   PATIENT GOALS  Get rid of pain     OBJECTIVE 02/08/22: poor transversus abdominus activation - he is able to achieve, but primarily with breath holding and alternates with bearing down; significant Bil adductor restriction, Rt>Lt; Rt obturator internus trigger points; trigger points and restriction throughout lumbar paraspinals and posterolateral hip mm  11/14/21: Squat WNL but patient reports increased Rt gluteal tightness (no loading); single leg squat with more knee instability and weight shift over Rt LE; lunge (normally reproduce pain lunging forward onto Lt at end range, but not currently) with more trunk rotation to Rt when lunging onto Rt; Rt glute strength 5/5, but tightness and pelvic pain with prolonged resistance; Rt hip adduction 4/5 with soreness and popping around Rt hip - no pelvic pain; tenderness with palpation of Rt ischial tuberosity and around obturator foramen/obturator internus in Rt sidelying; ASLR with resistance demonstrates increased core rotation with testing on Rt; *significant tenderness to palpation directly over Rt obturator internus FOTO: 85  10/24/21: Reduced and painful IR on Rt hip, no pain with resisted Rt adduction, tenderness externally to Rt OI; decreased stability with SL squat on Rt and Rt weight shift/Lt rotation with regular squat; significant tightness and tenderness throughout Rt glutes, hip flexors, and adductors; internal rectal exam demonstrated exquisite pain in Rt puborectalis and obturator internus   Objective 10/17/21:  DIAGNOSTIC FINDINGS:  Pelvic MRI 09/11/21 Pubic symphysis injection under flouroscopy 09/29/21   PATIENT SURVEYS:  FOTO 78%, goal 89%   COGNITION:           Overall cognitive status: Within functional limits for tasks assessed                          SENSATION: WFL   MUSCLE LENGTH: End range hamstrings limited Rt > Lt piriformis and gluteals limited   POSTURE:  scoliosis    PALPATION: Signif local tender to palpation inside Rt ischial tuberosity, pelvic floor TP present in Rt glut min, glut med, piriformis   LE ROM:   Active ROM Right 10/17/2021 Left 10/17/2021  Hip flexion      Hip extension      Hip abduction      Hip adduction      Hip internal rotation      Hip external rotation 50 55  Knee flexion      Knee extension      Ankle dorsiflexion      Ankle plantarflexion      Ankle inversion      Ankle eversion       (Blank rows = not tested)   LE MMT:   5/5 with with recreation of sharp pelvic pain on testing Rt hip abduction   LOWER EXTREMITY SPECIAL TESTS:  Hip special tests: Saralyn Pilar (FABER) test: negative SI joints unremarkable   FUNCTIONAL TESTS:  External palpation of pelvic floor lift and bulge: min lift, lacks bulge .  Pt denies difficulty with defecation or pain with this. Active SLR: lacks pelvic control with Lt LE lift     GAIT: WNL      TODAY'S TREATMENT 02/08/22 Manual: Soft tissue mobilization: Bil posterolateral hip mm Bil adductors/medial hamstrings/obturator internus Myofascial release: Borders of Rt obturator internus Bil adductors with myofascial tool Dry needling: Trigger Point Dry-Needling  Treatment instructions: Expect mild to moderate muscle soreness. S/S of pneumothorax if dry needled over a lung field, and to seek immediate medical attention should they occur. Patient verbalized understanding of these instructions and education.  Patient Consent Given: Yes Education handout provided: Previously provided Muscles treated: Bil glutes, TFL, piriformis, Lt L3-S2 paraspinals Electrical stimulation performed: No Parameters: N/A Treatment response/outcome: twitch response/muscle release Neuromuscular re-education: Core retraining:  Transversus abdominus training with multimodal cues for improved motor control and breath coordination; discussed how to incorporate this into work outs Core facilitation: Supine leg  extensions 10x bil Supine elevated heel taps 10x bil  TREATMENT 02/01/22 Manual: Mobilizations: Lateral hip mobs with use of mobilization strap  Soft tissue mobilization: Bil posterolateral hip mm Rt adductors/medial hamstrings/obturator internus  Myofascial release: Proximal Rt adductor attachments with utilization of massage tool Borders of Rt obturator internus Bil lumbar paraspinals with myofascial tool Dry needling: Trigger Point Dry-Needling  Treatment instructions: Expect mild to moderate muscle soreness. S/S of pneumothorax if dry needled over a lung field, and to seek immediate medical attention should they occur. Patient verbalized understanding of these instructions and education.  Patient Consent Given: Yes Education handout provided: Previously provided Muscles treated: Bil glutes, TFL, piriformis Electrical stimulation performed: No Parameters: N/A Treatment response/outcome: twitch response/muscle release   TREATMENT 01/25/22 Manual:  Soft tissue mobilization: Bil posterolateral hip mm Rt adductors/medial hamstrings/obturator internus Bil lumbar paraspinals  Myofascial release: Proximal Rt adductor attachments with utilization of massage tool Borders of Rt obturator internus Bil lumbar paraspinals with myofascial tool Dry needling:  Trigger Point Dry-Needling  Treatment instructions: Expect mild to moderate muscle soreness. S/S of pneumothorax if dry needled over a lung field, and to seek immediate medical attention should they occur. Patient verbalized understanding of these instructions and education.  Patient Consent Given: Yes Education handout provided: Previously provided Muscles treated: Bil glutes, TFL, piriformis, bil lumbar paraspinals L4-5  Electrical stimulation performed: Yes Parameters: Frequency 30 Hz, intensity to comfort Treatment response/outcome: twitch response/muscle release  PATIENT EDUCATION: Education details: Pt education  performed on incorporating deep core stabilizers into bigger more functional strengthening movements Person educated: Patient Education method: Consulting civil engineer, Demonstration, Tactile cues, Verbal cues, and Handouts Education comprehension: verbalized understanding     HOME EXERCISE PROGRAM: Access Code: [email or web address]    ASSESSMENT:   CLINICAL IMPRESSION: Pt has overall made great improvements in physical therapy with significant decrease in pelvic pain. However, he continues to report ongoing low back and bil gluteal tightness that is exacerbated by work out movements that require a greater degree of core stabilization. He demonstrates great core strength, but poor activation and motor control of transversus abdominus. Believe that abdominal bracing patterns and poor transversus abdominus activation may be contributing factor to increased pelvic floor, lumbar/sacral paraspinal, and gluteal tightness. He was able to perform this deep core activation with cues and made improvements throughout treatment session, being able to incorporate into smaller motor control exercises. He was encouraged to focus on utilizing this activation during bigger movements in order to provide greater lumbar stability, allowing glutes and pelvic floor to have to stabilize less, leading to less pain. He will continue to benefit from skilled PT intervention in order to improve core stability, decrease pain, and improve QOL.      OBJECTIVE IMPAIRMENTS decreased ROM, increased muscle spasms, impaired flexibility, impaired tone, and pain.    ACTIVITY LIMITATIONS  sport .    PERSONAL FACTORS Time since onset of injury/illness/exacerbation are also affecting patient's functional outcome.      REHAB POTENTIAL: Excellent   CLINICAL DECISION MAKING: Stable/uncomplicated   EVALUATION COMPLEXITY: Low     GOALS: Goals reviewed with patient? Yes   SHORT TERM GOALS: Target date: 10/31/2021 - updated 02/08/22   Pt will be able  to understand source of pelvic pain and learn tools to address dysfunction. Baseline: Goal status: MET 12/21/21        LONG TERM GOALS: Target date: 11/14/2021 - updated 02/08/22   Pt will report improved pelvic pain by at least 50% with exercise. Baseline: patient working towards greater pain reduction Goal status: MET 12/21/21   2.  Pt will be ind with HEP and understand how to safely progress. Baseline: Patient working on progressing Goal status: MET - 12/21/21   3.  Improve FOTO score to at least 89% to demo improved function. Baseline: 78% - improved to 85% Goal status: IN PROGRESS  4. Pt will demonstrate appropriate deep core activation and be able to perform weighted bent row without any increase in pain.  Baseline: increase in pain with bent row Goal status: INITIAL  5. Pt will add in variety of work outs throughout week, including yoga, to strengthen through various ranges of motion, improving core stability and decreasing low back/gluteal/pelvic pain.   Baseline: needs more variety and stabilization exercise  Goal status: INITIAL        PLAN: PT FREQUENCY: 1-2x/week   PT DURATION: 12 weeks   PLANNED INTERVENTIONS: Therapeutic exercises, Therapeutic activity, Neuromuscular re-education, Balance training, Patient/Family education, Joint mobilization, Dry Needling, Electrical stimulation, Spinal  mobilization, Cryotherapy, Moist heat, Biofeedback, and Manual therapy   PLAN FOR NEXT SESSION: DN and manual techniques for pain management; deep core strengthening progressions.   Heather Roberts, PT, DPT07/27/232:42 PM

## 2022-02-14 ENCOUNTER — Encounter: Payer: Self-pay | Admitting: Rehabilitative and Restorative Service Providers"

## 2022-02-14 ENCOUNTER — Ambulatory Visit: Payer: BC Managed Care – PPO | Attending: Sports Medicine | Admitting: Rehabilitative and Restorative Service Providers"

## 2022-02-14 DIAGNOSIS — R252 Cramp and spasm: Secondary | ICD-10-CM | POA: Insufficient documentation

## 2022-02-14 DIAGNOSIS — R293 Abnormal posture: Secondary | ICD-10-CM | POA: Diagnosis present

## 2022-02-14 NOTE — Therapy (Signed)
OUTPATIENT PHYSICAL THERAPY TREATMENT NOTE   Patient Name: Derrick Ellis MRN: [id number] DOB:[birth year], 21 y.o., male Today's Date: 02/14/2022  PCP: Cristine Polio, MD REFERRING PROVIDER: Cristine Polio, MD  END OF SESSION:   PT End of Session - 02/14/22 1150     Visit Number 20    Date for PT Re-Evaluation 05/03/22    Authorization Type BCBS    Authorization - Visit Number 69    Authorization - Number of Visits 30    PT Start Time 6384    PT Stop Time 1225    PT Time Calculation (min) 40 min    Activity Tolerance Patient tolerated treatment well    Behavior During Therapy [facility] for tasks assessed/performed                       History reviewed. No pertinent past medical history. History reviewed. No pertinent surgical history. There are no problems to display for this patient.   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 Yes NPRS scale: 0-5/10, intermittent Pain location: Rt buttock/pelvic region  Pain orientation: Right  PAIN TYPE: tight Pain description:  tight   Aggravating factors: lunge, when Lt foot is forward, getting in/out of car Relieving factors: stretching     SUBJECTIVE:     PERTINENT HISTORY: Pubic symphysis injection under fluoroscopy 09/29/21 Rt ACL reconstruction 04/02/2019, BPTB Autograft      PRECAUTIONS: None   WEIGHT BEARING RESTRICTIONS No   FALLS:  Has patient fallen in last 6 months? No   LIVING ENVIRONMENT: Lives with: dorm room, single Lives in: Other dorm room at college Stairs: sometimes pain with stairs Has following equipment at home: None   OCCUPATION: student, full time   PLOF: Independent   PATIENT GOALS  Get  rid of pain     OBJECTIVE  02/08/22: poor transversus abdominus activation - he is able to achieve, but primarily with breath holding and alternates with bearing down; significant Bil adductor restriction, Rt>Lt; Rt obturator internus trigger points; trigger points and restriction throughout lumbar paraspinals and posterolateral hip mm  11/14/21: Squat WNL but patient reports increased Rt gluteal tightness (no loading); single leg squat with more knee instability and weight shift over Rt LE; lunge (normally reproduce pain lunging forward onto Lt at end range, but not currently) with more trunk rotation to Rt when lunging onto Rt; Rt glute strength 5/5, but tightness and pelvic pain with prolonged resistance; Rt hip adduction 4/5 with soreness and popping around Rt hip - no pelvic pain; tenderness with palpation of Rt ischial tuberosity and around obturator foramen/obturator internus in Rt sidelying; ASLR with resistance demonstrates increased core rotation with testing on Rt; *significant tenderness to palpation directly over Rt obturator internus FOTO: 85  10/24/21: Reduced and painful IR on Rt hip, no pain with resisted Rt adduction, tenderness externally to Rt OI; decreased stability with SL squat on Rt and Rt weight shift/Lt rotation with regular squat; significant tightness and tenderness throughout Rt glutes, hip flexors, and adductors; internal rectal exam demonstrated exquisite pain in Rt puborectalis and obturator internus   Objective 10/17/21:  DIAGNOSTIC FINDINGS:  Pelvic MRI 09/11/21 Pubic symphysis injection under  flouroscopy 09/29/21   PATIENT SURVEYS:  FOTO 78%, goal 89%   COGNITION:           Overall cognitive status: Within functional limits for tasks assessed                          SENSATION: WFL   MUSCLE LENGTH: End range hamstrings limited Rt > Lt piriformis and gluteals limited   POSTURE:  scoliosis   PALPATION: Signif local tender to palpation inside Rt ischial  tuberosity, pelvic floor TP present in Rt glut min, glut med, piriformis   LE ROM:   Active ROM Right 10/17/2021 Left 10/17/2021  Hip flexion      Hip extension      Hip abduction      Hip adduction      Hip internal rotation      Hip external rotation 50 55  Knee flexion      Knee extension      Ankle dorsiflexion      Ankle plantarflexion      Ankle inversion      Ankle eversion       (Blank rows = not tested)   LE MMT:   5/5 with with recreation of sharp pelvic pain on testing Rt hip abduction   LOWER EXTREMITY SPECIAL TESTS:  Hip special tests: Saralyn Pilar (FABER) test: negative SI joints unremarkable   FUNCTIONAL TESTS:  External palpation of pelvic floor lift and bulge: min lift, lacks bulge .  Pt denies difficulty with defecation or pain with this. Active SLR: lacks pelvic control with Lt LE lift     GAIT: WNL      TODAY'S TREATMENT:  02/14/2022: Eliptical level 1 x5 minutes with PT present to discuss status Supine hamstring, hip adductors, IT band stretch with strap 2x20 sec bilat Manual:  Using addaday to right glut, hamstring, IT band and medial thigh Trigger Point Dry-Needling  Treatment instructions: Expect mild to moderate muscle soreness. S/S of pneumothorax if dry needled over a lung field, and to seek immediate medical attention should they occur. Patient verbalized understanding of these instructions and education. Patient Consent Given: Yes Education handout provided: Previously provided Muscles treated: Bil glutes, TFL, Left lumbar paraspinals Electrical stimulation performed: No Parameters: N/A Treatment response/outcome: twitch response/muscle release  02/08/22 Manual: Soft tissue mobilization: Bil posterolateral hip mm Bil adductors/medial hamstrings/obturator internus Myofascial release: Borders of Rt obturator internus Bil adductors with myofascial tool Dry needling: Trigger Point Dry-Needling  Treatment instructions: Expect mild to moderate  muscle soreness. S/S of pneumothorax if dry needled over a lung field, and to seek immediate medical attention should they occur. Patient verbalized understanding of these instructions and education. Patient Consent Given: Yes Education handout provided: Previously provided Muscles treated: Bil glutes, TFL, piriformis, Lt L3-S2 paraspinals Electrical stimulation performed: No Parameters: N/A Treatment response/outcome: twitch response/muscle release Neuromuscular re-education: Core retraining:  Transversus abdominus training with multimodal cues for improved motor control and breath coordination; discussed how to incorporate this into work outs Core facilitation: Supine leg extensions 10x bil Supine elevated heel taps 10x bil  TREATMENT 02/01/22 Manual: Mobilizations: Lateral hip mobs with use of mobilization strap  Soft tissue mobilization: Bil posterolateral hip mm Rt adductors/medial hamstrings/obturator internus  Myofascial release: Proximal Rt adductor attachments with utilization of massage tool Borders of Rt obturator internus Bil lumbar paraspinals with myofascial tool Dry needling: Trigger Point Dry-Needling  Treatment instructions: Expect mild to moderate muscle soreness. S/S of pneumothorax if  dry needled over a lung field, and to seek immediate medical attention should they occur. Patient verbalized understanding of these instructions and education. Patient Consent Given: Yes Education handout provided: Previously provided Muscles treated: Bil glutes, TFL, piriformis Electrical stimulation performed: No Parameters: N/A Treatment response/outcome: twitch response/muscle release   PATIENT EDUCATION: Education details: Pt education performed on incorporating deep core stabilizers into bigger more functional strengthening movements Person educated: Patient Education method: Consulting civil engineer, Demonstration, Tactile cues, Verbal cues, and Handouts Education comprehension:  verbalized understanding     HOME EXERCISE PROGRAM: Access Code: [email or web address]    ASSESSMENT:   CLINICAL IMPRESSION: Pt reports that he feels like the dry needling has helped, but he is unsure why he is having increased pain today.  Pt with good mobility noted with supine stretching and without pain on elliptical.  Pt with strong twitch response noted to left lumbar paraspinals.  Pt states that the team doctor has not yet investigated his back and his last MRI of his pelvis/hip only revealed inflammation.  Encouraged pt to reach out to the Team Doctor about checking his lumbar region secondary to the strong muscle twitch still noted with spasms.  Pt with twitch response also noted at lower glute region.  Pt reports that he has been performing TA bracing that he was taught by PT last session.     OBJECTIVE IMPAIRMENTS decreased ROM, increased muscle spasms, impaired flexibility, impaired tone, and pain.    ACTIVITY LIMITATIONS  sport .    PERSONAL FACTORS Time since onset of injury/illness/exacerbation are also affecting patient's functional outcome.      REHAB POTENTIAL: Excellent   CLINICAL DECISION MAKING: Stable/uncomplicated   EVALUATION COMPLEXITY: Low     GOALS: Goals reviewed with patient? Yes   SHORT TERM GOALS: Target date: 10/31/2021 - updated 02/08/22   Pt will be able to understand source of pelvic pain and learn tools to address dysfunction. Baseline: Goal status: MET 12/21/21        LONG TERM GOALS: Target date: 11/14/2021 - updated 02/08/22   Pt will report improved pelvic pain by at least 50% with exercise. Baseline: patient working towards greater pain reduction Goal status: MET 12/21/21   2.  Pt will be ind with HEP and understand how to safely progress. Baseline: Patient working on progressing Goal status: MET - 12/21/21   3.  Improve FOTO score to at least 89% to demo improved function. Baseline: 78% - improved to 85% Goal status: IN PROGRESS  4. Pt will  demonstrate appropriate deep core activation and be able to perform weighted bent row without any increase in pain.  Baseline: increase in pain with bent row Goal status: INITIAL  5. Pt will add in variety of work outs throughout week, including yoga, to strengthen through various ranges of motion, improving core stability and decreasing low back/gluteal/pelvic pain.   Baseline: needs more variety and stabilization exercise  Goal status: INITIAL        PLAN: PT FREQUENCY: 1-2x/week   PT DURATION: 12 weeks   PLANNED INTERVENTIONS: Therapeutic exercises, Therapeutic activity, Neuromuscular re-education, Balance training, Patient/Family education, Joint mobilization, Dry Needling, Electrical stimulation, Spinal mobilization, Cryotherapy, Moist heat, Biofeedback, and Manual therapy   PLAN FOR NEXT SESSION: reassess FOTO, DN and manual techniques for pain management; deep core strengthening progressions.   Juel Burrow, PT, DPT 08/02/231:39 PM  [facility] [address], [place], [place] [address] Phone # [phone] Fax [phone]

## 2022-02-21 ENCOUNTER — Ambulatory Visit: Payer: BC Managed Care – PPO

## 2022-02-21 DIAGNOSIS — R293 Abnormal posture: Secondary | ICD-10-CM

## 2022-02-21 DIAGNOSIS — R252 Cramp and spasm: Secondary | ICD-10-CM

## 2022-02-21 NOTE — Therapy (Signed)
OUTPATIENT PHYSICAL THERAPY TREATMENT NOTE   Patient Name: Derrick Ellis MRN: [id number] DOB:[birth year], 21 y.o., male Today's Date: 02/21/2022  PCP: Cristine Polio, MD REFERRING PROVIDER: Berle Mull, MD  END OF SESSION:   PT End of Session - 02/21/22 0929     Visit Number 21    Date for PT Re-Evaluation 05/03/22    Authorization Type BCBS    PT Start Time 0850    PT Stop Time 0924    PT Time Calculation (min) 34 min    Activity Tolerance Patient tolerated treatment well    Behavior During Therapy [facility] for tasks assessed/performed                        History reviewed. No pertinent past medical history. History reviewed. No pertinent surgical history. There are no problems to display for this patient.   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  I've been stretching.   PAIN:  PAIN:  Are you having pain? 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 No   LIVING ENVIRONMENT: Lives with: dorm room, single Lives in: Other dorm room at college Stairs: sometimes pain with stairs Has following equipment at home: None   OCCUPATION: student, full time   PLOF: Independent   PATIENT GOALS  Get rid of pain     OBJECTIVE  02/08/22: poor transversus abdominus activation - he is able to achieve, but  primarily with breath holding and alternates with bearing down; significant Bil adductor restriction, Rt>Lt; Rt obturator internus trigger points; trigger points and restriction throughout lumbar paraspinals and posterolateral hip mm  11/14/21: Squat WNL but patient reports increased Rt gluteal tightness (no loading); single leg squat with more knee instability and weight shift over Rt LE; lunge (normally reproduce pain lunging forward onto Lt at end range, but not currently) with more trunk rotation to Rt when lunging onto Rt; Rt glute strength 5/5, but tightness and pelvic pain with prolonged resistance; Rt hip adduction 4/5 with soreness and popping around Rt hip - no pelvic pain; tenderness with palpation of Rt ischial tuberosity and around obturator foramen/obturator internus in Rt sidelying; ASLR with resistance demonstrates increased core rotation with testing on Rt; *significant tenderness to palpation directly over Rt obturator internus FOTO: 85  10/24/21: Reduced and painful IR on Rt hip, no pain with resisted Rt adduction, tenderness externally to Rt OI; decreased stability with SL squat on Rt and Rt weight shift/Lt rotation with regular squat; significant tightness and tenderness throughout Rt glutes, hip flexors, and adductors; internal rectal exam demonstrated exquisite pain in Rt puborectalis and obturator internus   Objective 10/17/21:  DIAGNOSTIC FINDINGS:  Pelvic MRI 09/11/21 Pubic symphysis injection under flouroscopy 09/29/21   PATIENT SURVEYS:  FOTO 78%, goal 89%   COGNITION:  Overall cognitive status: Within functional limits for tasks assessed                          SENSATION: WFL   MUSCLE LENGTH: End range hamstrings limited Rt > Lt piriformis and gluteals limited   POSTURE:  scoliosis   PALPATION: Signif local tender to palpation inside Rt ischial tuberosity, pelvic floor TP present in Rt glut min, glut med, piriformis   LE ROM:   Active ROM  Right 10/17/2021 Left 10/17/2021  Hip flexion      Hip extension      Hip abduction      Hip adduction      Hip internal rotation      Hip external rotation 50 55  Knee flexion      Knee extension      Ankle dorsiflexion      Ankle plantarflexion      Ankle inversion      Ankle eversion       (Blank rows = not tested)   LE MMT:   5/5 with with recreation of sharp pelvic pain on testing Rt hip abduction   LOWER EXTREMITY SPECIAL TESTS:  Hip special tests: Saralyn Pilar (FABER) test: negative SI joints unremarkable   FUNCTIONAL TESTS:  External palpation of pelvic floor lift and bulge: min lift, lacks bulge .  Pt denies difficulty with defecation or pain with this. Active SLR: lacks pelvic control with Lt LE lift     GAIT: WNL      TODAY'S TREATMENT: 02/21/2022:  Discussion and demo of all HEP stretches. Pt doesn't follow a [employer] handout, does his own thing.  PT approved all stretches and suggested he try to stretch more frequently throughout the day.  Trigger Point Dry-Needling  Treatment instructions: Expect mild to moderate muscle soreness. S/S of pneumothorax if dry needled over a lung field, and to seek immediate medical attention should they occur. Patient verbalized understanding of these instructions and education. Patient Consent Given: Yes Education handout provided: Previously provided Muscles treated: Bil glutes, TFL, Left lumbar paraspinals Electrical stimulation performed: No Parameters: N/A Treatment response/outcome: twitch response/muscle release  02/14/2022: Eliptical level 1 x5 minutes with PT present to discuss status Supine hamstring, hip adductors, IT band stretch with strap 2x20 sec bilat Manual:  Using addaday to right glut, hamstring, IT band and medial thigh Trigger Point Dry-Needling  Treatment instructions: Expect mild to moderate muscle soreness. S/S of pneumothorax if dry needled over a lung field, and to seek immediate medical attention should they  occur. Patient verbalized understanding of these instructions and education. Patient Consent Given: Yes Education handout provided: Previously provided Muscles treated: Bil glutes, TFL, Left lumbar paraspinals Electrical stimulation performed: No Parameters: N/A Treatment response/outcome: twitch response/muscle release  02/08/22 Manual: Soft tissue mobilization: Bil posterolateral hip mm Bil adductors/medial hamstrings/obturator internus Myofascial release: Borders of Rt obturator internus Bil adductors with myofascial tool Dry needling: Trigger Point Dry-Needling  Treatment instructions: Expect mild to moderate muscle soreness. S/S of pneumothorax if dry needled over a lung field, and to seek immediate medical attention should they occur. Patient verbalized understanding of these instructions and education. Patient Consent Given: Yes Education handout provided: Previously provided Muscles treated: Bil glutes, TFL, piriformis, Lt L3-S2 paraspinals Electrical stimulation performed: No Parameters: N/A Treatment response/outcome: twitch response/muscle release Neuromuscular re-education: Core retraining:  Transversus abdominus training with multimodal cues for improved motor control and breath coordination; discussed how to incorporate this into work outs  Core facilitation: Supine leg extensions 10x bil Supine elevated heel taps 10x bil    PATIENT EDUCATION: Education details: Pt education performed on incorporating deep core stabilizers into bigger more functional strengthening movements Person educated: Patient Education method: Explanation, Demonstration, Tactile cues, Verbal cues, and Handouts Education comprehension: verbalized understanding     HOME EXERCISE PROGRAM: Access Code: [email or web address]    ASSESSMENT:   CLINICAL IMPRESSION: Session spent discussing home stretching program.  Pt reports that he has a consistent routine and demonstrated aspects to PT.  PT suggested  that he stretch more frequently throughout the day as he is only stretching 1x/day for a prolonged period.  PT suggested stretching 3x/day for shorter bouts and offered suggestions for stretches.  Pt is not seeing consistent gains with PT and reports continued chronic stiffness and pain.  Pt was guarded with needling today with twitch response in the lumbar spine Lt>Rt.     OBJECTIVE IMPAIRMENTS decreased ROM, increased muscle spasms, impaired flexibility, impaired tone, and pain.    ACTIVITY LIMITATIONS  sport .    PERSONAL FACTORS Time since onset of injury/illness/exacerbation are also affecting patient's functional outcome.      REHAB POTENTIAL: Excellent   CLINICAL DECISION MAKING: Stable/uncomplicated   EVALUATION COMPLEXITY: Low     GOALS: Goals reviewed with patient? Yes   SHORT TERM GOALS: Target date: 10/31/2021 - updated 02/08/22   Pt will be able to understand source of pelvic pain and learn tools to address dysfunction. Baseline: Goal status: MET 12/21/21        LONG TERM GOALS: Target date: 11/14/2021 - updated 02/08/22   Pt will report improved pelvic pain by at least 50% with exercise. Baseline: patient working towards greater pain reduction Goal status: MET 12/21/21   2.  Pt will be ind with HEP and understand how to safely progress. Baseline: Patient working on progressing Goal status: MET - 12/21/21   3.  Improve FOTO score to at least 89% to demo improved function. Baseline: 78% - improved to 85% Goal status: IN PROGRESS  4. Pt will demonstrate appropriate deep core activation and be able to perform weighted bent row without any increase in pain.  Baseline: increase in pain with bent row Goal status: INITIAL  5. Pt will add in variety of work outs throughout week, including yoga, to strengthen through various ranges of motion, improving core stability and decreasing low back/gluteal/pelvic pain.   Baseline: needs more variety and stabilization exercise  Goal  status: INITIAL        PLAN: PT FREQUENCY: 1-2x/week   PT DURATION: 12 weeks   PLANNED INTERVENTIONS: Therapeutic exercises, Therapeutic activity, Neuromuscular re-education, Balance training, Patient/Family education, Joint mobilization, Dry Needling, Electrical stimulation, Spinal mobilization, Cryotherapy, Moist heat, Biofeedback, and Manual therapy   PLAN FOR NEXT SESSION: Probable D/C in 1-2 visits.  Pt will investigate a new MD to follow-up with.  Sigurd Sos, PT 02/21/22 9:30 AM   [facility] [address], [place], [place] [address] Phone # [phone] Fax [phone]

## 2022-03-09 ENCOUNTER — Ambulatory Visit: Payer: BC Managed Care – PPO | Admitting: Rehabilitative and Restorative Service Providers"

## 2022-03-09 ENCOUNTER — Encounter: Payer: Self-pay | Admitting: Rehabilitative and Restorative Service Providers"

## 2022-03-09 DIAGNOSIS — R252 Cramp and spasm: Secondary | ICD-10-CM

## 2022-03-09 DIAGNOSIS — R293 Abnormal posture: Secondary | ICD-10-CM

## 2022-03-09 NOTE — Therapy (Signed)
OUTPATIENT PHYSICAL THERAPY TREATMENT NOTE   Patient Name: Derrick Ellis MRN: [id number] DOB:14-Aug-2000, 21 y.o., male Today's Date: 03/09/2022  PCP: Cristine Polio, MD REFERRING PROVIDER: Berle Mull, MD  END OF SESSION:   PT End of Session - 03/09/22 0805     Visit Number 22    Date for PT Re-Evaluation 05/03/22    Authorization Type BCBS    Authorization - Visit Number 21    Authorization - Number of Visits 30    PT Start Time 0800    PT Stop Time 0840    PT Time Calculation (min) 40 min    Activity Tolerance Patient tolerated treatment well    Behavior During Therapy [facility] for tasks assessed/performed                        History reviewed. No pertinent past medical history. History reviewed. No pertinent surgical history. There are no problems to display for this patient.   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  Reports some pain after a big workout, but reports that he has been using massage gun more and has noticed improved pain.  PAIN:  PAIN:  Are you having pain? No NPRS scale: 0/10 currently Pain location: Rt buttock/pelvic region  Pain orientation: Right  PAIN TYPE: tight Pain description:  tight   Aggravating factors:  after running routes for football, pain all day after Relieving factors: stretching, after sleeping     SUBJECTIVE:     PERTINENT HISTORY: Pubic symphysis injection under fluoroscopy 09/29/21 Rt ACL reconstruction 04/02/2019, BPTB Autograft      PRECAUTIONS: None   WEIGHT BEARING RESTRICTIONS No   FALLS:  Has patient fallen in last 6 months? No   LIVING ENVIRONMENT: Lives with: dorm room, single Lives in: Other dorm room at college Stairs: sometimes pain with stairs Has following equipment at  home: None   OCCUPATION: student, full time   PLOF: Independent   PATIENT GOALS  Get rid of pain     OBJECTIVE  03/09/2022:  FOTO 90%  02/08/22: poor transversus abdominus activation - he is able to achieve, but primarily with breath holding and alternates with bearing down; significant Bil adductor restriction, Rt>Lt; Rt obturator internus trigger points; trigger points and restriction throughout lumbar paraspinals and posterolateral hip mm  11/14/21: Squat WNL but patient reports increased Rt gluteal tightness (no loading); single leg squat with more knee instability and weight shift over Rt LE; lunge (normally reproduce pain lunging forward onto Lt at end range, but not currently) with more trunk rotation to Rt when lunging onto Rt; Rt glute strength 5/5, but tightness and pelvic pain with prolonged resistance; Rt hip adduction 4/5 with soreness and popping around Rt hip - no pelvic pain; tenderness with palpation of Rt ischial tuberosity and around obturator foramen/obturator internus in Rt sidelying; ASLR with resistance demonstrates increased core rotation with testing on Rt; *significant tenderness to palpation directly over Rt obturator internus FOTO: 85  10/24/21: Reduced and painful IR on Rt hip, no pain with resisted Rt adduction, tenderness externally to Rt OI; decreased stability with SL squat on Rt and Rt weight shift/Lt rotation with regular squat; significant tightness and tenderness throughout Rt glutes, hip flexors, and adductors; internal rectal exam  demonstrated exquisite pain in Rt puborectalis and obturator internus   Objective 10/17/21:  DIAGNOSTIC FINDINGS:  Pelvic MRI 09/11/21 Pubic symphysis injection under flouroscopy 09/29/21   PATIENT SURVEYS:  FOTO 78%, goal 89%   COGNITION:           Overall cognitive status: Within functional limits for tasks assessed                          SENSATION: WFL   MUSCLE LENGTH: End range hamstrings limited Rt > Lt piriformis  and gluteals limited   POSTURE:  scoliosis   PALPATION: Signif local tender to palpation inside Rt ischial tuberosity, pelvic floor TP present in Rt glut min, glut med, piriformis   LE ROM:   Active ROM Right 10/17/2021 Left 10/17/2021  Hip flexion      Hip extension      Hip abduction      Hip adduction      Hip internal rotation      Hip external rotation 50 55  Knee flexion      Knee extension      Ankle dorsiflexion      Ankle plantarflexion      Ankle inversion      Ankle eversion       (Blank rows = not tested)   LE MMT:   5/5 with with recreation of sharp pelvic pain on testing Rt hip abduction   LOWER EXTREMITY SPECIAL TESTS:  Hip special tests: Saralyn Pilar (FABER) test: negative SI joints unremarkable   FUNCTIONAL TESTS:  External palpation of pelvic floor lift and bulge: min lift, lacks bulge .  Pt denies difficulty with defecation or pain with this. Active SLR: lacks pelvic control with Lt LE lift     GAIT: WNL      TODAY'S TREATMENT: 03/09/2022: Discussion of HEP, use of massage gun, and possible follow up with MD FOTO 90% Trigger Point Dry-Needling  Treatment instructions: Expect mild to moderate muscle soreness. S/S of pneumothorax if dry needled over a lung field, and to seek immediate medical attention should they occur. Patient verbalized understanding of these instructions and education. Patient Consent Given: Yes Education handout provided: Previously provided Muscles treated: Bil glutes, TFL, Bilat lumbar paraspinals Electrical stimulation performed: No Parameters: N/A Treatment response/outcome: twitch response/muscle release Manual:  Using addaday to right glut, hamstring, IT band and medial thigh   02/21/2022:  Discussion and demo of all HEP stretches. Pt doesn't follow a [employer] handout, does his own thing.  PT approved all stretches and suggested he try to stretch more frequently throughout the day.  Trigger Point Dry-Needling  Treatment  instructions: Expect mild to moderate muscle soreness. S/S of pneumothorax if dry needled over a lung field, and to seek immediate medical attention should they occur. Patient verbalized understanding of these instructions and education. Patient Consent Given: Yes Education handout provided: Previously provided Muscles treated: Bil glutes, TFL, Left lumbar paraspinals Electrical stimulation performed: No Parameters: N/A Treatment response/outcome: twitch response/muscle release  02/14/2022: Eliptical level 1 x5 minutes with PT present to discuss status Supine hamstring, hip adductors, IT band stretch with strap 2x20 sec bilat Manual:  Using addaday to right glut, hamstring, IT band and medial thigh Trigger Point Dry-Needling  Treatment instructions: Expect mild to moderate muscle soreness. S/S of pneumothorax if dry needled over a lung field, and to seek immediate medical attention should they occur. Patient verbalized understanding of these instructions and education. Patient Consent Given: Yes  Education handout provided: Previously provided Muscles treated: Bil glutes, TFL, Left lumbar paraspinals Electrical stimulation performed: No Parameters: N/A Treatment response/outcome: twitch response/muscle release     PATIENT EDUCATION: Education details: Pt education performed on incorporating deep core stabilizers into bigger more functional strengthening movements Person educated: Patient Education method: Explanation, Demonstration, Tactile cues, Verbal cues, and Handouts Education comprehension: verbalized understanding     HOME EXERCISE PROGRAM: Access Code: [email or web address]    ASSESSMENT:   CLINICAL IMPRESSION: Kerin presents to skilled PT stating that he has noticed that he is feeling some better. Reports that if he only gets the pain when he has a more strenuous work out or games.  Pt states that he has an ice bath scheduled with his athletic trainers today and he will ask about a MD  follow up to further assess his back.  Pt has met all goals at this time and is able to perform addaday to his thighs, as needed, and has noted some improvements.  Pt has met all outpatient PT goals and is being discharged from skilled PT at this time to continue with HEP and his athletic trainers at college.     OBJECTIVE IMPAIRMENTS decreased ROM, increased muscle spasms, impaired flexibility, impaired tone, and pain.    ACTIVITY LIMITATIONS  sport .    PERSONAL FACTORS Time since onset of injury/illness/exacerbation are also affecting patient's functional outcome.      REHAB POTENTIAL: Excellent   CLINICAL DECISION MAKING: Stable/uncomplicated   EVALUATION COMPLEXITY: Low     GOALS: Goals reviewed with patient? Yes   SHORT TERM GOALS: Target date: 10/31/2021 - updated 02/08/22   Pt will be able to understand source of pelvic pain and learn tools to address dysfunction. Baseline: Goal status: MET 12/21/21        LONG TERM GOALS: Target date: 11/14/2021 - updated 02/08/22   Pt will report improved pelvic pain by at least 50% with exercise. Baseline: patient working towards greater pain reduction Goal status: MET 12/21/21   2.  Pt will be ind with HEP and understand how to safely progress. Baseline: Patient working on progressing Goal status: MET - 12/21/21   3.  Improve FOTO score to at least 89% to demo improved function. Baseline: 78% - improved to 85% Goal status: Goal Met 03/09/2022  4. Pt will demonstrate appropriate deep core activation and be able to perform weighted bent row without any increase in pain.  Baseline: increase in pain with bent row Goal status: Goal Met 03/09/22  5. Pt will add in variety of work outs throughout week, including yoga, to strengthen through various ranges of motion, improving core stability and decreasing low back/gluteal/pelvic pain.   Baseline: needs more variety and stabilization exercise  Goal status: Goal Met 03/09/22        PLAN: PT  FREQUENCY: 1-2x/week   PT DURATION: 12 weeks   PLANNED INTERVENTIONS: Therapeutic exercises, Therapeutic activity, Neuromuscular re-education, Balance training, Patient/Family education, Joint mobilization, Dry Needling, Electrical stimulation, Spinal mobilization, Cryotherapy, Moist heat, Biofeedback, and Manual therapy   PLAN FOR NEXT SESSION: Discharged on 03/09/22.  Pt to explore following up with MD about pelvic pain vs lumbar dysfunction.    PHYSICAL THERAPY DISCHARGE SUMMARY   Patient agrees to discharge. Patient goals were met. Patient is being discharged due to meeting the stated rehab goals.     Juel Burrow, PT 03/09/22 10:34 AM   [facility] [address] [place], [place] [address] Phone # [phone] Fax  [phone]

## 2022-03-12 ENCOUNTER — Ambulatory Visit: Payer: BC Managed Care – PPO

## 2022-03-23 ENCOUNTER — Encounter: Payer: PRIVATE HEALTH INSURANCE | Admitting: Rehabilitative and Restorative Service Providers"

## 2022-04-30 ENCOUNTER — Ambulatory Visit: Payer: PRIVATE HEALTH INSURANCE

## 2022-07-26 ENCOUNTER — Ambulatory Visit: Payer: PRIVATE HEALTH INSURANCE | Admitting: Physical Therapy

## 2022-07-31 ENCOUNTER — Ambulatory Visit: Payer: BC Managed Care – PPO | Attending: Family Medicine | Admitting: Physical Therapy

## 2022-07-31 DIAGNOSIS — M869 Osteomyelitis, unspecified: Secondary | ICD-10-CM | POA: Insufficient documentation

## 2022-07-31 DIAGNOSIS — R293 Abnormal posture: Secondary | ICD-10-CM | POA: Diagnosis present

## 2022-07-31 DIAGNOSIS — R252 Cramp and spasm: Secondary | ICD-10-CM | POA: Insufficient documentation

## 2022-07-31 NOTE — Therapy (Signed)
OUTPATIENT PHYSICAL THERAPY MALE PELVIC EVALUATION   Patient Name: Derrick Ellis MRN: [id number] DOB:2000-07-20, 22 y.o., male Today's Date: 08/01/2022  END OF SESSION:  PT End of Session - 08/01/22 1622     Visit Number 1    Date for PT Re-Evaluation 10/23/22    Authorization Type BCBS    PT Start Time 1103    PT Stop Time 1145    PT Time Calculation (min) 42 min    Activity Tolerance Patient tolerated treatment well    Behavior During Therapy [facility] for tasks assessed/performed             History reviewed. No pertinent past medical history. History reviewed. No pertinent surgical history. There are no problems to display for this patient.   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  Pt states the pain stops me from running.  Lifting doesn't bother me. Playing football and starting spring off season this week. Have run Saturday and was very sore, was sprinting and cutting.  Fluid intake: a lot water  PAIN:  Are you having pain? Yes NPRS scale: 8/10 Pain location:  bilateral groin and lower abdomen  Pain type: aching and sore Pain description: intermittent   Aggravating factors: running makes it worse Relieving factors: just not running until it is better  PRECAUTIONS: None  WEIGHT BEARING RESTRICTIONS: No  FALLS:  Has patient fallen in last 6 months? No  LIVING ENVIRONMENT: Lives with: lives with their family and but at school right now Lives in:  House/apartment   OCCUPATION: student and playing sports  PLOF: Independent  PATIENT GOALS: be able to run without pain  PERTINENT HISTORY:  Rt ACL surgery, Rt adductor strain   BOWEL MOVEMENT: Pain with bowel movement: No Type of bowel movement:Type (Bristol Stool Scale) normal, Frequency every other day, and Strain No Fully empty rectum: Yes:    Fiber supplement: Yes: greens supplement  URINATION: Pain with urination: No Fully empty bladder: No Stream: Strong Urgency: No Frequency: normal Leakage:  Pads:   INTERCOURSE: Pain with intercourse:  No Climax: normal Ejaculation:  OBJECTIVE:   DIAGNOSTIC FINDINGS:    PATIENT SURVEYS:    PFIQ-7   COGNITION: Overall cognitive status: Within functional limits for tasks assessed     SENSATION: Light touch:  Proprioception: Appears intact  MUSCLE LENGTH: Hamstrings: 75% Thomas test:   LUMBAR SPECIAL TESTS:  Straight leg raise test: Negative  FUNCTIONAL TESTS:  Dead lift - uses lumbar flexion when appx <110 deg hip flexion Single leg dead lift - lateral trunk shift on Rt LE; lumbar flexion bil Single leg dead lift with rotational resistance weight shift to the Rt and knee valgus  GAIT:  Comments: WFL   POSTURE:  scoliosis  PELVIC ALIGNMENT:  LUMBARAROM/PROM:  A/PROM A/PROM  eval  Flexion WFL   Extension   Right lateral flexion   Left lateral flexion   Right rotation   Left rotation    (Blank rows = not tested)  LOWER EXTREMITY AROM/PROM:  A/PROM Right eval Left eval  Hip flexion 5/5 5/5  Hip extension 4/5 4/5  Hip abduction 5/5 5/5  Hip adduction 4/5 pain 4/5 pain on Rt side  Hip internal rotation 5/5 5/5  Hip external rotation 4/5 pain 5/5  Knee flexion    Knee extension    Ankle dorsiflexion    Ankle plantarflexion    Ankle inversion    Ankle eversion     (Blank rows = not tested)  LOWER EXTREMITY MMT: Testing out of 0-5 strength scale MMT Right eval Left eval  Hip  flexion 5 5  Hip extension    Hip abduction 4 4 with pain  Hip adduction 4 with pain 4 with pain in Rt groin  Hip internal rotation 5 5  Hip external rotation 4+ 5  Knee flexion    Knee extension    Ankle dorsiflexion    Ankle plantarflexion    Ankle inversion    Ankle eversion     PALPATION: GENERAL adductors tight bil and pain with               External Perineal Exam palpation externally - no tenderness              Internal Pelvic Floor not assessed due to no reports of symptoms Patient confirms identification and approves PT to assess internal pelvic floor and treatment No - not discussed today  PELVIC MMT:   MMT eval  Internal Anal Sphincter   External Anal Sphincter   Puborectalis   Diastasis Recti   (Blank rows = not tested)  TONE:   TODAY'S TREATMENT:                                                                                                                              DATE: 07/31/22                  EVAL given intial exercises - skater lunges - single leg dead lift - hip ER with band   PATIENT EDUCATION:  Education details: exercises above 3 sets of 10  Person educated: Patient Education method: Explanation, Demonstration, Tactile cues, and Verbal cues Education comprehension: verbalized understanding and returned demonstration  HOME EXERCISE PROGRAM: No handout due to slow internet  ASSESSMENT:  CLINICAL IMPRESSION: 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   OBJECTIVE IMPAIRMENTS: decreased coordination, decreased endurance, decreased strength, increased muscle spasms, postural dysfunction, and pain.   ACTIVITY LIMITATIONS:  sport  PARTICIPATION LIMITATIONS: community activity  PERSONAL FACTORS: 1-2 comorbidities: ACL repair and groin strain on Rt side  are also affecting patient's functional outcome.   REHAB POTENTIAL: Good  CLINICAL DECISION MAKING:  Evolving/moderate complexity  EVALUATION COMPLEXITY: Moderate   GOALS: Goals reviewed with patient? Yes  SHORT TERM GOALS: Target date: 08/28/22  Pt will be 5/5 hip adduction and external rotation bilaterally without pain for improved running related activities. Baseline: Goal status: INITIAL  2.  Pt will be able to run for practice without cutting and no increased pain due to improved hip stability Baseline:  Goal status: INITIAL    LONG TERM GOALS: Target date: 10/23/22  Pt will be independent with advanced HEP to maintain improvements made throughout therapy  Baseline:  Goal status: INITIAL  2.  Pt will report 90% reduction of pain after running due to improvements in posture, strength, and muscle length  Baseline:  Goal status: INITIAL  3.  Pt will be able to do single leg activities with moderate to heavy resistance for at least 30 reps bil to withstand forces needed for football activities. Baseline:  Goal status: INITIAL  4.  Pt will be able to a full practice of running/cutting drills with 1/10 max pain the next day and only lasting for one day at most Baseline:  Goal status: INITIAL     PLAN:  PT FREQUENCY: 1x/week  PT DURATION: 12 weeks  PLANNED INTERVENTIONS: Therapeutic exercises, Therapeutic activity, Neuromuscular re-education, Balance training, Gait training, Patient/Family education, Self Care, Joint mobilization, Dry Needling, Electrical stimulation, Cryotherapy, Moist heat, Taping, Biofeedback, Manual therapy, and Re-evaluation  PLAN FOR NEXT SESSION: f/u on initial HEP; assess adductor, lumbar, obturator for muscle spasms and dry needling as needed; progress single leg strength with varying vector loading   Camillo Flaming Artrice Kraker, PT 08/01/2022, 4:58 PM

## 2022-08-01 ENCOUNTER — Other Ambulatory Visit: Payer: Self-pay

## 2022-08-01 ENCOUNTER — Encounter: Payer: Self-pay | Admitting: Physical Therapy

## 2022-08-27 NOTE — Progress Notes (Unsigned)
    Derrick Ellis Derrick Ellis Phone: [phone]   Assessment and Plan:     There are no diagnoses linked to this encounter.  ***   Pertinent previous records reviewed include ***   Follow Up: ***     Subjective:   I, Derrick Ellis, am serving as a Education administrator for Doctor Glennon Mac  Chief Complaint: pubic symphysis pain   HPI:   08/28/2022 Patient is a 22 year old male complaining of pubic symphysis pain . Patient states   Relevant Historical Information: ***  Additional pertinent review of systems negative.  No current outpatient medications on file.   Objective:     There were no vitals filed for this visit.    There is no height or weight on file to calculate BMI.    Physical Exam:    ***   Electronically signed by:  Derrick Ellis D.[facility] 4:27 PM 08/27/22

## 2022-08-28 ENCOUNTER — Ambulatory Visit (INDEPENDENT_AMBULATORY_CARE_PROVIDER_SITE_OTHER): Payer: BC Managed Care – PPO | Admitting: Sports Medicine

## 2022-08-28 VITALS — BP 120/80 | HR 62 | Ht 69.0 in | Wt 158.0 lb

## 2022-08-28 DIAGNOSIS — M9903 Segmental and somatic dysfunction of lumbar region: Secondary | ICD-10-CM | POA: Diagnosis not present

## 2022-08-28 DIAGNOSIS — M9904 Segmental and somatic dysfunction of sacral region: Secondary | ICD-10-CM

## 2022-08-28 DIAGNOSIS — M9905 Segmental and somatic dysfunction of pelvic region: Secondary | ICD-10-CM

## 2022-08-28 DIAGNOSIS — S3981XA Other specified injuries of abdomen, initial encounter: Secondary | ICD-10-CM | POA: Diagnosis not present

## 2022-08-28 MED ORDER — MELOXICAM 15 MG PO TABS: 15.0000 mg | ORAL_TABLET | Freq: Every day | ORAL | 0 refills | Status: AC

## 2022-08-28 NOTE — Patient Instructions (Addendum)
Good to see you  - Start meloxicam 15 mg daily x2 weeks.  If still having pain after 2 weeks, complete 3rd-week of meloxicam. May use remaining meloxicam as needed once daily for pain control.  Do not to use additional NSAIDs while taking meloxicam.  May use Tylenol [phone] mg 2 to 3 times a day for breakthrough pain. Sports hernia HEP  2 week follow up

## 2022-09-06 ENCOUNTER — Ambulatory Visit: Payer: BC Managed Care – PPO | Attending: Family Medicine

## 2022-09-06 DIAGNOSIS — R252 Cramp and spasm: Secondary | ICD-10-CM | POA: Diagnosis present

## 2022-09-06 DIAGNOSIS — M6281 Muscle weakness (generalized): Secondary | ICD-10-CM

## 2022-09-06 DIAGNOSIS — R279 Unspecified lack of coordination: Secondary | ICD-10-CM | POA: Diagnosis present

## 2022-09-06 DIAGNOSIS — R293 Abnormal posture: Secondary | ICD-10-CM

## 2022-09-06 NOTE — Patient Instructions (Signed)

## 2022-09-06 NOTE — Therapy (Signed)
OUTPATIENT PHYSICAL THERAPY TREATMENT NOTE   Patient Name: Derrick Ellis MRN: [id number] DOB:11/26/2000, 22 y.o., male Today's Date: 09/06/2022  PCP: Cristine Polio, MD REFERRING PROVIDER: Cleophas Dunker, MD  END OF SESSION:   PT End of Session - 09/06/22 1102     Visit Number 2    Date for PT Re-Evaluation 10/23/22    Authorization Type BCBS    PT Start Time 1100    PT Stop Time 1140    PT Time Calculation (min) 40 min    Activity Tolerance Patient tolerated treatment well    Behavior During Therapy [facility] for tasks assessed/performed             History reviewed. No pertinent past medical history. History reviewed. No pertinent surgical history. There are no problems to display for this patient.   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 He was unable to run. He is able to run now and feels like the pain has progressively gotten better. Rest has helped, but he also has been doing chiropractic care 1x/week. When he is running/sprinting, it may take time to warm up, but then is not painful; he is sore in Rt lower quadrant and adductors/hips. He continues to stretch and foam roll every night. He is lifting 3x/weekly. Practice starts in 3 weeks.    PAIN:  Are you having pain? Yes: NPRS scale: 6/10 Pain location: Rt lower quadrant/adductors/hip Pain description: achy/tight Aggravating factors: sprinting, running,  jumping Relieving factors: rest  SUBJECTIVE STATEMENT: Pain is from 3 to 8-9/10 depending on what I have done before.  Pt states the pain stops me from running.  Lifting doesn't bother me. Playing football and starting spring off season this week. Have run Saturday and was very sore, was sprinting and cutting.  Fluid intake: a lot water   PAIN:  Are you having pain? Yes NPRS scale: 8/10 Pain location:  bilateral groin and lower abdomen   Pain type: aching and sore Pain description: intermittent    Aggravating factors: running makes it worse Relieving factors: just not running until it is  better   PRECAUTIONS: None   WEIGHT BEARING RESTRICTIONS: No   FALLS:  Has patient fallen in last 6 months? No   LIVING ENVIRONMENT: Lives with: lives with their family and but at school right now Lives in: House/apartment     OCCUPATION: student and playing sports   PLOF: Independent   PATIENT GOALS: be able to run without pain   PERTINENT HISTORY:  Rt ACL surgery, Rt adductor strain     BOWEL MOVEMENT: Pain with bowel movement: No Type of bowel movement:Type (Bristol Stool Scale) normal, Frequency every other day, and Strain No Fully empty rectum: Yes:     Fiber supplement: Yes: greens supplement   URINATION: Pain with urination: No Fully empty bladder: No Stream: Strong Urgency: No Frequency: normal Leakage:  Pads:    INTERCOURSE: Pain with intercourse:  No Climax: normal Ejaculation:      OBJECTIVE:    DIAGNOSTIC FINDINGS:      PATIENT SURVEYS:      PFIQ-7    COGNITION: Overall cognitive status: Within functional limits for tasks assessed                          SENSATION: Light touch:  Proprioception: Appears intact   MUSCLE LENGTH: Hamstrings: 75% Thomas test:    LUMBAR SPECIAL TESTS:  Straight leg raise test: Negative   FUNCTIONAL TESTS:  Dead lift - uses lumbar flexion when appx <110 deg hip flexion Single leg dead lift - lateral trunk  shift on Rt LE; lumbar flexion bil Single leg dead lift with rotational resistance weight shift to the Rt and knee valgus   GAIT:   Comments: WFL    POSTURE:  scoliosis   PELVIC ALIGNMENT:   LUMBARAROM/PROM:   A/PROM A/PROM  eval  Flexion WFL   Extension    Right lateral flexion    Left lateral flexion    Right rotation    Left rotation     (Blank rows = not tested)   LOWER EXTREMITY AROM/PROM:   A/PROM Right eval Left eval  Hip flexion 5/5 5/5  Hip extension 4/5 4/5  Hip abduction 5/5 5/5  Hip adduction 4/5 pain 4/5 pain on Rt side  Hip internal rotation 5/5 5/5  Hip external rotation 4/5 pain 5/5  Knee flexion      Knee extension      Ankle dorsiflexion      Ankle plantarflexion      Ankle inversion      Ankle eversion       (Blank rows = not tested)   LOWER EXTREMITY MMT: Testing out of 0-5 strength scale MMT Right eval Left eval  Hip flexion 5 5  Hip extension      Hip abduction 4 4 with pain  Hip adduction 4 with pain 4 with pain in Rt groin  Hip internal rotation 5 5  Hip external rotation 4+ 5  Knee flexion      Knee extension      Ankle dorsiflexion      Ankle plantarflexion      Ankle inversion      Ankle eversion        PALPATION: GENERAL adductors tight bil and pain with                External Perineal Exam palpation externally - no tenderness               Internal Pelvic Floor not assessed due  to no reports of symptoms Patient confirms identification and approves PT to assess internal pelvic floor and treatment No - not discussed today   PELVIC MMT:   MMT eval  Internal Anal Sphincter    External Anal Sphincter    Puborectalis    Diastasis Recti    (Blank rows = not tested)   TONE:     TODAY'S TREATMENT 09/06/22: Manual: Soft tissue mobilization bil glutes Trigger Point Dry-Needling  Treatment instructions: Expect mild to moderate muscle soreness. S/S of pneumothorax if dry needled over a lung field, and to seek  immediate medical attention should they occur. Patient verbalized understanding of these instructions and education.  Patient Consent Given: Yes Education handout provided: Yes Muscles treated: bil glutes Electrical stimulation performed: No Parameters: N/A Treatment response/outcome: twitch response/release  Neuromuscular re-education: Bridge with hip adduction Bridge march Bridge Straight leg raise                                                                                                                               DATE: 07/31/22                  EVAL given intial exercises - skater lunges - single leg dead lift - hip ER with band     PATIENT EDUCATION:  Education details: exercises above 3 sets of 10  Person educated: Patient Education method: Explanation, Demonstration, Tactile cues, and Verbal cues Education comprehension: verbalized understanding and returned demonstration   HOME EXERCISE PROGRAM: No handout due to slow internet   ASSESSMENT:   CLINICAL IMPRESSION: Pt seen for first follow-up treatment since return to physical therapy. He is making progress with focusing in deep core activation, especially on Rt side. He is able to achieve deep core activation, but has overactivity in Rt rectus. He may benefit form dry needling in abdominal muscles, QL, and adductors, all of which remain very tight/overactive and tender. He demonstrated great difficulty with strength and motor control in bridge/march and bridge/single leg lower. He will work on these prior to runs to see if that helps decrease soreness afterwards. 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    OBJECTIVE IMPAIRMENTS: decreased coordination, decreased endurance, decreased strength, increased muscle spasms, postural dysfunction, and pain.    ACTIVITY LIMITATIONS:  sport   PARTICIPATION LIMITATIONS: community activity   PERSONAL  FACTORS: 1-2 comorbidities: ACL repair and groin strain on Rt side  are also affecting patient's functional outcome.    REHAB POTENTIAL: Good   CLINICAL DECISION MAKING: Evolving/moderate complexity   EVALUATION COMPLEXITY: Moderate     GOALS: Goals reviewed with patient? Yes   SHORT TERM GOALS: Target date: 08/28/22   Pt will be 5/5 hip adduction and external rotation bilaterally without pain for improved running related activities. Baseline: Goal status: INITIAL   2.  Pt will be able to run for practice without  cutting and no increased pain due to improved hip stability Baseline:  Goal status: INITIAL       LONG TERM GOALS: Target date: 10/23/22   Pt will be independent with advanced HEP to maintain improvements made throughout therapy  Baseline:  Goal status: INITIAL   2.  Pt will report 90% reduction of pain after running due to improvements in posture, strength, and muscle length   Baseline:  Goal status: INITIAL   3.  Pt will be able to do single leg activities with moderate to heavy resistance for at least 30 reps bil to withstand forces needed for football activities. Baseline:  Goal status: INITIAL   4.  Pt will be able to a full practice of running/cutting drills with 1/10 max pain the next day and only lasting for one day at most Baseline:  Goal status: INITIAL         PLAN:   PT FREQUENCY: 1x/week   PT DURATION: 12 weeks   PLANNED INTERVENTIONS: Therapeutic exercises, Therapeutic activity, Neuromuscular re-education, Balance training, Gait training, Patient/Family education, Self Care, Joint mobilization, Dry Needling, Electrical stimulation, Cryotherapy, Moist heat, Taping, Biofeedback, Manual therapy, and Re-evaluation   PLAN FOR NEXT SESSION: f/u on initial HEP; assess adductor, lumbar, obturator for muscle spasms and dry needling as needed; progress single leg strength with varying vector loading  Heather Roberts, PT, DPT02/22/2411:44 AM

## 2022-09-07 NOTE — Progress Notes (Unsigned)
   Derrick Ellis D.[place] Phone: [phone]   Assessment and Plan:     There are no diagnoses linked to this encounter.  *** - Patient has received significant relief with OMT in the past.  Elects for repeat OMT today.  Tolerated well per note below. - Decision today to treat with OMT was based on Physical Exam   After verbal consent patient was treated with HVLA (high velocity low amplitude), ME (muscle energy), FPR (flex positional release), ST (soft tissue), PC/PD (Pelvic Compression/ Pelvic Decompression) techniques in cervical, rib, thoracic, lumbar, and pelvic areas. Patient tolerated the procedure well with improvement in symptoms.  Patient educated on potential side effects of soreness and recommended to rest, hydrate, and use Tylenol as needed for pain control.   Pertinent previous records reviewed include ***   Follow Up: ***     Subjective:   I, Derrick Ellis, am serving as a Education administrator for Doctor Glennon Mac   Chief Complaint: pubic symphysis pain    HPI:    08/28/2022 Patient is a 22 year old male complaining of pubic symphysis pain . Patient states that he has pain in the front of his pubic area for about 5 months, football player increased playing time , wasn't able to run, wasn't able to get through warm up , wasn't able to play through season, soreness radiates up and down his legs up to the ASIS, no MOI, hx of lower leg injuries , hx of pelvic floor stuff that he dealt with last spring, no meds for the pain , no numbness or tingling, pain when he runs and post work outs , but when he is at rest he is fine    09/11/2022 Patient states   Relevant Historical Information: None pertinent    Additional pertinent review of systems negative.  Current Outpatient Medications  Medication Sig Dispense Refill   meloxicam (MOBIC) 15 MG tablet Take 1 tablet (15 mg total) by mouth daily. 30 tablet 0   No  current facility-administered medications for this visit.      Objective:     There were no vitals filed for this visit.    There is no height or weight on file to calculate BMI.    Physical Exam:     General: Well-appearing, cooperative, sitting comfortably in no acute distress.   OMT Physical Exam:  ASIS Compression Test: Positive Right Cervical: TTP paraspinal, *** Rib: Bilateral elevated first rib with TTP Thoracic: TTP paraspinal,*** Lumbar: TTP paraspinal,*** Pelvis: Right anterior innominate  Electronically signed by:  Derrick Ellis D.[facility] 7:42 AM 09/07/22

## 2022-09-11 ENCOUNTER — Ambulatory Visit (INDEPENDENT_AMBULATORY_CARE_PROVIDER_SITE_OTHER): Payer: BC Managed Care – PPO | Admitting: Sports Medicine

## 2022-09-11 ENCOUNTER — Ambulatory Visit: Payer: BC Managed Care – PPO

## 2022-09-11 VITALS — BP 124/78 | HR 107 | Ht 69.0 in | Wt 156.0 lb

## 2022-09-11 DIAGNOSIS — M9905 Segmental and somatic dysfunction of pelvic region: Secondary | ICD-10-CM

## 2022-09-11 DIAGNOSIS — M9906 Segmental and somatic dysfunction of lower extremity: Secondary | ICD-10-CM | POA: Diagnosis not present

## 2022-09-11 DIAGNOSIS — R252 Cramp and spasm: Secondary | ICD-10-CM

## 2022-09-11 DIAGNOSIS — M6281 Muscle weakness (generalized): Secondary | ICD-10-CM

## 2022-09-11 DIAGNOSIS — S3981XD Other specified injuries of abdomen, subsequent encounter: Secondary | ICD-10-CM

## 2022-09-11 DIAGNOSIS — M9903 Segmental and somatic dysfunction of lumbar region: Secondary | ICD-10-CM | POA: Diagnosis not present

## 2022-09-11 DIAGNOSIS — R279 Unspecified lack of coordination: Secondary | ICD-10-CM

## 2022-09-11 DIAGNOSIS — R293 Abnormal posture: Secondary | ICD-10-CM

## 2022-09-11 NOTE — Patient Instructions (Signed)
Complete 1 more week of meloxicam 4 week follow up

## 2022-09-11 NOTE — Therapy (Signed)
OUTPATIENT PHYSICAL THERAPY TREATMENT NOTE   Patient Name: Derrick Ellis MRN: [id number] DOB:14-Sep-2000, 22 y.o., male Today's Date: 09/11/2022  PCP: Cristine Polio, MD REFERRING PROVIDER: Cleophas Dunker, MD  END OF SESSION:   PT End of Session - 09/11/22 1530     Visit Number 3    Date for PT Re-Evaluation 10/23/22    Authorization Type BCBS    PT Start Time 1530    PT Stop Time 1610    PT Time Calculation (min) 40 min    Activity Tolerance Patient tolerated treatment well    Behavior During Therapy [facility] for tasks assessed/performed              History reviewed. No pertinent past medical history. History reviewed. No pertinent surgical history. There are no problems to display for this patient.   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 He also reports low back pain on Rt side.    PAIN:  Are you having pain? Yes: NPRS scale: 6/10 Pain location: Rt lower quadrant/adductors/hip Pain description: achy/tight Aggravating factors: sprinting, running, jumping Relieving factors: rest  SUBJECTIVE STATEMENT: Pain is from 3 to 8-9/10 depending on what I have done before.  Pt states the pain stops me from running.  Lifting doesn't bother me. Playing football and starting spring off season this week. Have run Saturday and was very sore, was sprinting and cutting.  Fluid intake: a lot water   PAIN:  Are  you having pain? Yes NPRS scale: 8/10 Pain location:  bilateral groin and lower abdomen   Pain type: aching and sore Pain description: intermittent    Aggravating factors: running makes it worse Relieving factors: just not running until it is better   PRECAUTIONS: None   WEIGHT BEARING RESTRICTIONS: No   FALLS:  Has patient fallen in last 6 months? No   LIVING ENVIRONMENT: Lives with: lives with their family and but at school right now Lives in: House/apartment     OCCUPATION: student and playing sports   PLOF: Independent   PATIENT GOALS: be able to  run without pain   PERTINENT HISTORY:  Rt ACL surgery, Rt adductor strain     BOWEL MOVEMENT: Pain with bowel movement: No Type of bowel movement:Type (Bristol Stool Scale) normal, Frequency every other day, and Strain No Fully empty rectum: Yes:     Fiber supplement: Yes: greens supplement   URINATION: Pain with urination: No Fully empty bladder: No Stream: Strong Urgency: No Frequency: normal Leakage:  Pads:    INTERCOURSE: Pain with intercourse:  No Climax: normal Ejaculation:      OBJECTIVE:    DIAGNOSTIC FINDINGS:      PATIENT SURVEYS:      PFIQ-7    COGNITION: Overall cognitive status: Within functional limits for tasks assessed                          SENSATION: Light touch:  Proprioception: Appears intact   MUSCLE LENGTH: Hamstrings: 75% Thomas test:    LUMBAR SPECIAL TESTS:  Straight leg raise test: Negative   FUNCTIONAL TESTS:  Dead lift - uses lumbar flexion when appx <110 deg hip flexion Single leg dead lift - lateral trunk shift on Rt LE; lumbar flexion bil Single leg dead lift with rotational resistance weight shift to the Rt and knee valgus   GAIT:   Comments: WFL    POSTURE:  scoliosis   PELVIC ALIGNMENT:   LUMBARAROM/PROM:   A/PROM A/PROM  eval  Flexion WFL   Extension    Right lateral flexion    Left lateral flexion    Right rotation    Left rotation      (Blank rows = not tested)   LOWER EXTREMITY AROM/PROM:   A/PROM Right eval Left eval  Hip flexion 5/5 5/5  Hip extension 4/5 4/5  Hip abduction 5/5 5/5  Hip adduction 4/5 pain 4/5 pain on Rt side  Hip internal rotation 5/5 5/5  Hip external rotation 4/5 pain 5/5  Knee flexion      Knee extension      Ankle dorsiflexion      Ankle plantarflexion      Ankle inversion      Ankle eversion       (Blank rows = not tested)   LOWER EXTREMITY MMT: Testing out of 0-5 strength scale MMT Right eval Left eval  Hip flexion 5 5  Hip extension      Hip abduction 4 4 with pain  Hip adduction 4 with pain 4 with pain in Rt groin  Hip internal rotation 5 5  Hip external rotation 4+ 5  Knee flexion      Knee extension      Ankle dorsiflexion      Ankle plantarflexion      Ankle inversion      Ankle eversion        PALPATION: GENERAL adductors tight bil and pain with                External Perineal Exam palpation externally - no tenderness               Internal Pelvic Floor not assessed due to no reports of symptoms Patient confirms identification and approves PT to assess internal pelvic floor and treatment No - not discussed today   PELVIC MMT:   MMT eval  Internal Anal Sphincter    External Anal Sphincter    Puborectalis    Diastasis Recti    (Blank rows = not tested)   TONE:  TODAY'S TREATMENT 09/11/22 Manual: Trigger Point Dry-Needling  Treatment instructions: Expect mild to moderate muscle soreness. S/S of pneumothorax if dry needled over a lung field, and to seek immediate medical attention should they occur. Patient verbalized understanding of these instructions and education.  Patient Consent Given: Yes Education handout provided: Yes Muscles treated: Rt iliacus, obliques, QL Electrical stimulation performed: No Parameters: N/A Treatment response/outcome: twitch response/release Soft tissue mobilization to Rt lower quadrant/lumbar paraspinals Negative  pressure soft tissue mobilization to Rt lower quadrant/lumbar paraspinals Neuromuscular re-education: 3-way kick on Rt LE on airex 10x each bil Single leg RDL on airex 10x Lateral touch down 2 x 10 Rt Single leg fitter board 2 x 10 front back   TREATMENT 09/06/22: Manual: Soft tissue mobilization bil glutes Trigger Point Dry-Needling  Treatment instructions: Expect mild to moderate muscle soreness. S/S of pneumothorax if dry needled over a lung field, and to seek immediate medical attention should they occur. Patient verbalized understanding of these instructions and education.  Patient Consent Given: Yes Education handout provided: Yes Muscles treated: bil glutes Electrical stimulation performed: No Parameters: N/A Treatment response/outcome: twitch response/release  Neuromuscular re-education: Bridge with hip adduction Bridge march Bridge Straight leg raise                                                                                                                               DATE: 07/31/22                  EVAL given intial exercises - skater lunges - single leg dead lift - hip ER with band     PATIENT EDUCATION:  Education details: exercises above 3 sets of 10  Person educated: Patient Education method: Explanation, Demonstration, Tactile cues, and Verbal cues Education comprehension: verbalized understanding and returned demonstration   HOME EXERCISE PROGRAM: No handout due to slow internet   ASSESSMENT:   CLINICAL IMPRESSION: Pt reporting more soreness today after running yesterday. He had notable trigger points in Rt obliques and QL. DN performed with twitch response and excellent release. Significant myofascial restriction over Rt flank that improved with negative pressure soft tissue mobilization. Tolerated single leg activities to help improve core control well; much more difficult on Rt when compared to Lt LE. He will continue to benefit from skilled PT  intervention in order to decrease Rt sided abdominal/pelvic/low back pain and return to football/training activities without any pain.    OBJECTIVE IMPAIRMENTS: decreased coordination, decreased endurance, decreased strength, increased muscle spasms, postural dysfunction, and pain.    ACTIVITY LIMITATIONS:  sport   PARTICIPATION LIMITATIONS: community activity   PERSONAL FACTORS: 1-2 comorbidities: ACL repair and groin strain on Rt side  are also affecting patient's functional outcome.    REHAB POTENTIAL: Good   CLINICAL DECISION MAKING: Evolving/moderate complexity   EVALUATION COMPLEXITY: Moderate     GOALS: Goals reviewed with patient? Yes   SHORT TERM GOALS: Target date: 08/28/22   Pt will  be 5/5 hip adduction and external rotation bilaterally without pain for improved running related activities. Baseline: Goal status: INITIAL   2.  Pt will be able to run for practice without cutting and no increased pain due to improved hip stability Baseline:  Goal status: INITIAL       LONG TERM GOALS: Target date: 10/23/22   Pt will be independent with advanced HEP to maintain improvements made throughout therapy  Baseline:  Goal status: INITIAL   2.  Pt will report 90% reduction of pain after running due to improvements in posture, strength, and muscle length   Baseline:  Goal status: INITIAL   3.  Pt will be able to do single leg activities with moderate to heavy resistance for at least 30 reps bil to withstand forces needed for football activities. Baseline:  Goal status: INITIAL   4.  Pt will be able to a full practice of running/cutting drills with 1/10 max pain the next day and only lasting for one day at most Baseline:  Goal status: INITIAL         PLAN:   PT FREQUENCY: 1x/week   PT DURATION: 12 weeks   PLANNED INTERVENTIONS: Therapeutic exercises, Therapeutic activity, Neuromuscular re-education, Balance training, Gait training, Patient/Family education, Self  Care, Joint mobilization, Dry Needling, Electrical stimulation, Cryotherapy, Moist heat, Taping, Biofeedback, Manual therapy, and Re-evaluation   PLAN FOR NEXT SESSION: f/u on initial HEP; assess adductor, lumbar, obturator for muscle spasms and dry needling as needed; progress single leg strength with varying vector loading  Heather Roberts, PT, DPT02/27/244:14 PM

## 2022-09-18 ENCOUNTER — Ambulatory Visit: Payer: BC Managed Care – PPO | Attending: Family Medicine

## 2022-09-18 DIAGNOSIS — R279 Unspecified lack of coordination: Secondary | ICD-10-CM | POA: Diagnosis present

## 2022-09-18 DIAGNOSIS — R252 Cramp and spasm: Secondary | ICD-10-CM | POA: Diagnosis present

## 2022-09-18 DIAGNOSIS — R293 Abnormal posture: Secondary | ICD-10-CM | POA: Diagnosis present

## 2022-09-18 DIAGNOSIS — M6281 Muscle weakness (generalized): Secondary | ICD-10-CM | POA: Diagnosis present

## 2022-09-18 NOTE — Therapy (Signed)
OUTPATIENT PHYSICAL THERAPY TREATMENT NOTE   Patient Name: Derrick Ellis MRN: [id number] DOB:[birth year], 22 y.o., male Today's Date: 09/18/2022  PCP: Cristine Polio, MD REFERRING PROVIDER: Cleophas Dunker, MD  END OF SESSION:   PT End of Session - 09/18/22 1531     Visit Number 4    Date for PT Re-Evaluation 10/23/22    Authorization Type BC[employer]    PT Start Time 1530    PT Stop Time 1603    PT Time Calculation (min) 33 min    Activity Tolerance Patient tolerated treatment well    Behavior During Therapy [facility] for tasks assessed/performed              History reviewed. No pertinent past medical history. History reviewed. No pertinent surgical history. There are no problems to display for this patient.   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 He was able to run 40s without pain or residual soreness. He lifted yesterday and is now having increased Rt glute pain.    PAIN:  Are you having pain? Yes: NPRS scale: 6/10 Pain location: Rt lower quadrant/adductors/hip Pain description: achy/tight Aggravating factors: sprinting, running, jumping Relieving factors: rest  SUBJECTIVE STATEMENT: Pain is from 3 to 8-9/10 depending on what I have done before.  Pt states the pain stops me from running.  Lifting doesn't bother me. Playing football and starting spring off season this week. Have run Saturday and was very sore, was  sprinting and cutting.  Fluid intake: a lot water   PAIN:  Are you having pain? Yes NPRS scale: 8/10 Pain location:  bilateral groin and lower abdomen   Pain type: aching and sore Pain description: intermittent    Aggravating factors: running makes it worse Relieving factors: just not running until it is better   PRECAUTIONS: None   WEIGHT BEARING RESTRICTIONS: No   FALLS:  Has patient fallen in last 6 months? No   LIVING ENVIRONMENT: Lives with: lives with their family and but at school right now Lives in: House/apartment     OCCUPATION: student and playing sports  PLOF: Independent   PATIENT GOALS: be able to run without pain   PERTINENT HISTORY:  Rt ACL surgery, Rt adductor strain     BOWEL MOVEMENT: Pain with bowel movement: No Type of bowel movement:Type (Bristol Stool Scale) normal, Frequency every other day, and Strain No Fully empty rectum: Yes:     Fiber supplement: Yes: greens supplement   URINATION: Pain with urination: No Fully empty bladder: No Stream: Strong Urgency: No Frequency: normal Leakage:  Pads:    INTERCOURSE: Pain with intercourse:  No Climax: normal Ejaculation:      OBJECTIVE:    DIAGNOSTIC FINDINGS:      PATIENT SURVEYS:      PFIQ-7    COGNITION: Overall cognitive status: Within functional limits for tasks assessed                          SENSATION: Light touch:  Proprioception: Appears intact   MUSCLE LENGTH: Hamstrings: 75% Thomas test:    LUMBAR SPECIAL TESTS:  Straight leg raise test: Negative   FUNCTIONAL TESTS:  Dead lift - uses lumbar flexion when appx <110 deg hip flexion Single leg dead lift - lateral trunk shift on Rt LE; lumbar flexion bil Single leg dead lift with rotational resistance weight shift to the Rt and knee valgus   GAIT:   Comments: WFL    POSTURE:  scoliosis   PELVIC ALIGNMENT:   LUMBARAROM/PROM:   A/PROM A/PROM  eval  Flexion WFL   Extension    Right lateral  flexion    Left lateral flexion    Right rotation    Left rotation     (Blank rows = not tested)   LOWER EXTREMITY AROM/PROM:   A/PROM Right eval Left eval  Hip flexion 5/5 5/5  Hip extension 4/5 4/5  Hip abduction 5/5 5/5  Hip adduction 4/5 pain 4/5 pain on Rt side  Hip internal rotation 5/5 5/5  Hip external rotation 4/5 pain 5/5  Knee flexion      Knee extension      Ankle dorsiflexion      Ankle plantarflexion      Ankle inversion      Ankle eversion       (Blank rows = not tested)   LOWER EXTREMITY MMT: Testing out of 0-5 strength scale MMT Right eval Left eval  Hip flexion 5 5  Hip extension      Hip abduction 4 4 with pain  Hip adduction 4 with pain 4 with pain in Rt groin  Hip internal rotation 5 5  Hip external rotation 4+ 5  Knee flexion      Knee extension      Ankle dorsiflexion      Ankle plantarflexion      Ankle inversion      Ankle eversion        PALPATION: GENERAL adductors tight bil and pain with                External Perineal Exam palpation externally - no tenderness               Internal Pelvic Floor not assessed due to no reports of symptoms Patient confirms identification and approves PT to assess internal pelvic floor and treatment No - not discussed today   PELVIC MMT:   MMT eval  Internal Anal Sphincter    External Anal Sphincter    Puborectalis    Diastasis Recti    (  Blank rows = not tested)   TONE:     TODAY'S TREATMENT 09/18/22 Manual: Trigger Point Dry-Needling  Treatment instructions: Expect mild to moderate muscle soreness. S/S of pneumothorax if dry needled over a lung field, and to seek immediate medical attention should they occur. Patient verbalized understanding of these instructions and education.  Patient Consent Given: Yes Education handout provided: Yes Muscles treated: Rt iliacus, obliques, QL Electrical stimulation performed: No Parameters: N/A Treatment response/outcome: twitch response/release Soft  tissue mobilization to Rt lower quadrant/lumbar paraspinals Neuromuscular re-education: Supine bil and unilateral weight drop 3-way kick with resistance for improved core activation Core facilitation during UE work outs    TREATMENT 09/11/22 Manual: Trigger Point Dry-Needling  Treatment instructions: Expect mild to moderate muscle soreness. S/S of pneumothorax if dry needled over a lung field, and to seek immediate medical attention should they occur. Patient verbalized understanding of these instructions and education.  Patient Consent Given: Yes Education handout provided: Yes Muscles treated: Rt iliacus, obliques, QL Electrical stimulation performed: No Parameters: N/A Treatment response/outcome: twitch response/release Soft tissue mobilization to Rt lower quadrant/lumbar paraspinals Negative pressure soft tissue mobilization to Rt lower quadrant/lumbar paraspinals Neuromuscular re-education: 3-way kick on Rt LE on airex 10x each bil Single leg RDL on airex 10x Lateral touch down 2 x 10 Rt Single leg fitter board 2 x 10 front back   TREATMENT 09/06/22: Manual: Soft tissue mobilization bil glutes Trigger Point Dry-Needling  Treatment instructions: Expect mild to moderate muscle soreness. S/S of pneumothorax if dry needled over a lung field, and to seek immediate medical attention should they occur. Patient verbalized understanding of these instructions and education.  Patient Consent Given: Yes Education handout provided: Yes Muscles treated: bil glutes Electrical stimulation performed: No Parameters: N/A Treatment response/outcome: twitch response/release  Neuromuscular re-education: Bridge with hip adduction Bridge march Bridge Straight leg raise    PATIENT EDUCATION:  Education details: see above Person educated: Patient Education method: Consulting civil engineer, Demonstration, Tactile cues, and Verbal cues Education comprehension: verbalized understanding and returned  demonstration   HOME EXERCISE PROGRAM:    ASSESSMENT:   CLINICAL IMPRESSION: Pt overall doing well with ability to run without pain after treatment session last week. Trigger points in abdominals and lumbar paraspinals still present, but improved. Good tolerance to dry needling and manual techniques with significant twitch response. Discussed purposefully performing core activation with coordinated breathing during UE exercises, not just core/leg exercises. He will continue to benefit from skilled PT intervention in order to decrease Rt sided abdominal/pelvic/low back pain and return to football/training activities without any pain.    OBJECTIVE IMPAIRMENTS: decreased coordination, decreased endurance, decreased strength, increased muscle spasms, postural dysfunction, and pain.    ACTIVITY LIMITATIONS:  sport   PARTICIPATION LIMITATIONS: community activity   PERSONAL FACTORS: 1-2 comorbidities: ACL repair and groin strain on Rt side  are also affecting patient's functional outcome.    REHAB POTENTIAL: Good   CLINICAL DECISION MAKING: Evolving/moderate complexity   EVALUATION COMPLEXITY: Moderate     GOALS: Goals reviewed with patient? Yes   SHORT TERM GOALS: Target date: 08/28/22 - updated 09/18/22   Pt will be 5/5 hip adduction and external rotation bilaterally without pain for improved running related activities. Baseline: Goal status: IN PROGRESS   2.  Pt will be able to run for practice without cutting and no increased pain due to improved hip stability Baseline:  Goal status: IN PROGRESS       LONG TERM GOALS: Target date: 10/23/22 - updated 09/18/22  Pt will be independent with advanced HEP to maintain improvements made throughout therapy  Baseline:  Goal status: IN PROGRESS   2.  Pt will report 90% reduction of pain after running due to improvements in posture, strength, and muscle length   Baseline:  Goal status: IN PROGRESS   3.  Pt will be able to do single leg  activities with moderate to heavy resistance for at least 30 reps bil to withstand forces needed for football activities. Baseline:  Goal status: IN PROGRESS   4.  Pt will be able to a full practice of running/cutting drills with 1/10 max pain the next day and only lasting for one day at most Baseline:  Goal status: IN PROGRESS         PLAN:   PT FREQUENCY: 1x/week   PT DURATION: 12 weeks   PLANNED INTERVENTIONS: Therapeutic exercises, Therapeutic activity, Neuromuscular re-education, Balance training, Gait training, Patient/Family education, Self Care, Joint mobilization, Dry Needling, Electrical stimulation, Cryotherapy, Moist heat, Taping, Biofeedback, Manual therapy, and Re-evaluation   PLAN FOR NEXT SESSION: f/u on initial HEP; assess adductor, lumbar, obturator for muscle spasms and dry needling as needed; progress single leg strength with varying vector loading  Heather Roberts, PT, DPT03/05/244:13 PM

## 2022-09-25 ENCOUNTER — Ambulatory Visit: Payer: BC Managed Care – PPO

## 2022-09-25 DIAGNOSIS — R252 Cramp and spasm: Secondary | ICD-10-CM | POA: Diagnosis not present

## 2022-09-25 DIAGNOSIS — R279 Unspecified lack of coordination: Secondary | ICD-10-CM | POA: Diagnosis present

## 2022-09-25 DIAGNOSIS — R293 Abnormal posture: Secondary | ICD-10-CM

## 2022-09-25 DIAGNOSIS — M6281 Muscle weakness (generalized): Secondary | ICD-10-CM | POA: Diagnosis present

## 2022-09-25 NOTE — Therapy (Signed)
OUTPATIENT PHYSICAL THERAPY TREATMENT NOTE   Patient Name: Derrick Ellis MRN: [id number] DOB:2000-08-25, 22 y.o., male Today's Date: 09/25/2022  PCP: Cristine Polio, MD REFERRING PROVIDER: Cleophas Dunker, MD  END OF SESSION:   PT End of Session - 09/25/22 1530     Visit Number 5    Date for PT Re-Evaluation 10/23/22    Authorization Type [employer]    PT Start Time 1528    PT Stop Time 1610    PT Time Calculation (min) 42 min    Activity Tolerance Patient tolerated treatment well    Behavior During Therapy [facility] for tasks assessed/performed               History reviewed. No pertinent past medical history. History reviewed. No pertinent surgical history. There are no problems to display for this patient.   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 He was more sore in Rt lower abdomen and feels like he can pinpoint sore spot. He also reports more significant soreness in bil adductors. He starts practice this week.    PAIN:  Are you having pain? Yes: NPRS scale: 6/10 Pain location: Rt lower quadrant/adductors/hip Pain description: achy/tight Aggravating factors: sprinting, running, jumping Relieving factors: rest  SUBJECTIVE STATEMENT: Pain is from 3 to 8-9/10 depending on what I have done before.  Pt states the pain stops me from running.  Lifting doesn't bother me. Playing football and starting  spring off season this week. Have run Saturday and was very sore, was sprinting and cutting.  Fluid intake: a lot water   PAIN:  Are you having pain? Yes NPRS scale: 8/10 Pain location:  bilateral groin and lower abdomen   Pain type: aching and sore Pain description: intermittent    Aggravating factors: running makes it worse Relieving factors: just not running until it is better   PRECAUTIONS: None   WEIGHT BEARING RESTRICTIONS: No   FALLS:  Has patient fallen in last 6 months? No   LIVING ENVIRONMENT: Lives with: lives with their family and but at school right now Lives  in: House/apartment     OCCUPATION: student and playing sports   PLOF: Independent   PATIENT GOALS: be able to run without pain   PERTINENT HISTORY:  Rt ACL surgery, Rt adductor strain     BOWEL MOVEMENT: Pain with bowel movement: No Type of bowel movement:Type (Bristol Stool Scale) normal, Frequency every other day, and Strain No Fully empty rectum: Yes:     Fiber supplement: Yes: greens supplement   URINATION: Pain with urination: No Fully empty bladder: No Stream: Strong Urgency: No Frequency: normal Leakage:  Pads:    INTERCOURSE: Pain with intercourse:  No Climax: normal Ejaculation:      OBJECTIVE:    DIAGNOSTIC FINDINGS:      PATIENT SURVEYS:      PFIQ-7    COGNITION: Overall cognitive status: Within functional limits for tasks assessed                          SENSATION: Light touch:  Proprioception: Appears intact   MUSCLE LENGTH: Hamstrings: 75% Thomas test:    LUMBAR SPECIAL TESTS:  Straight leg raise test: Negative   FUNCTIONAL TESTS:  Dead lift - uses lumbar flexion when appx <110 deg hip flexion Single leg dead lift - lateral trunk shift on Rt LE; lumbar flexion bil Single leg dead lift with rotational resistance weight shift to the Rt and knee valgus   GAIT:   Comments: WFL    POSTURE:  scoliosis   PELVIC ALIGNMENT:   LUMBARAROM/PROM:    A/PROM A/PROM  eval  Flexion WFL   Extension    Right lateral flexion    Left lateral flexion    Right rotation    Left rotation     (Blank rows = not tested)   LOWER EXTREMITY AROM/PROM:   A/PROM Right eval Left eval  Hip flexion 5/5 5/5  Hip extension 4/5 4/5  Hip abduction 5/5 5/5  Hip adduction 4/5 pain 4/5 pain on Rt side  Hip internal rotation 5/5 5/5  Hip external rotation 4/5 pain 5/5  Knee flexion      Knee extension      Ankle dorsiflexion      Ankle plantarflexion      Ankle inversion      Ankle eversion       (Blank rows = not tested)   LOWER EXTREMITY MMT: Testing out of 0-5 strength scale MMT Right eval Left eval  Hip flexion 5 5  Hip extension      Hip abduction 4 4 with pain  Hip adduction 4 with pain 4 with pain in Rt groin  Hip internal rotation 5 5  Hip external rotation 4+ 5  Knee flexion      Knee extension      Ankle dorsiflexion      Ankle plantarflexion      Ankle inversion      Ankle eversion        PALPATION: GENERAL adductors tight bil and pain with                External Perineal Exam palpation externally - no tenderness               Internal Pelvic Floor not assessed due to no reports of symptoms Patient confirms identification and approves PT to assess internal pelvic floor and treatment No - not discussed today   PELVIC MMT:   MMT eval  Internal Anal Sphincter    External Anal  Sphincter    Puborectalis    Diastasis Recti    (Blank rows = not tested)   TONE:     TODAY'S TREATMENT 09/25/22 Manual: Trigger Point Dry-Needling  Treatment instructions: Expect mild to moderate muscle soreness. S/S of pneumothorax if dry needled over a lung field, and to seek immediate medical attention should they occur. Patient verbalized understanding of these instructions and education.  Patient Consent Given: Yes Education handout provided: Yes Muscles treated: Rt iliacus, obliques, common iliopsoas insertion,  adductors Electrical stimulation performed: No Parameters: N/A Treatment response/outcome: twitch response/release Instrument assisted soft tissue mobilization to Rt lower quadrant Sof ttissue mobilization to Rt lower quadrant Neuromuscular re-education: Lateral gliders  Bear crawl walk Modified pistol squat with UE support   TREATMENT 09/18/22 Manual: Trigger Point Dry-Needling  Treatment instructions: Expect mild to moderate muscle soreness. S/S of pneumothorax if dry needled over a lung field, and to seek immediate medical attention should they occur. Patient verbalized understanding of these instructions and education.  Patient Consent Given: Yes Education handout provided: Yes Muscles treated: Rt iliacus, obliques, QL Electrical stimulation performed: No Parameters: N/A Treatment response/outcome: twitch response/release Soft tissue mobilization to Rt lower quadrant/lumbar paraspinals Neuromuscular re-education: Supine bil and unilateral weight drop 3-way kick with resistance for improved core activation Core facilitation during UE work outs    TREATMENT 09/11/22 Manual: Trigger Point Dry-Needling  Treatment instructions: Expect mild to moderate muscle soreness. S/S of pneumothorax if dry needled over a lung field, and to seek immediate medical attention should they occur. Patient verbalized understanding of these instructions and education.  Patient Consent Given: Yes Education handout provided: Yes Muscles treated: Rt iliacus, obliques, QL Electrical stimulation performed: No Parameters: N/A Treatment response/outcome: twitch response/release Soft tissue mobilization to Rt lower quadrant/lumbar paraspinals Negative pressure soft tissue mobilization to Rt lower quadrant/lumbar paraspinals Neuromuscular re-education: 3-way kick on Rt LE on airex 10x each bil Single leg RDL on airex 10x Lateral touch down 2 x 10 Rt Single leg fitter board 2 x 10 front back     PATIENT EDUCATION:  Education details: see above Person educated: Patient Education method: Explanation, Demonstration, Tactile cues, and Verbal cues Education comprehension: verbalized understanding and returned demonstration   HOME EXERCISE PROGRAM:    ASSESSMENT:   CLINICAL IMPRESSION: Pt had a better week with pain and work outs. Due to pinpoint pain in lower Rt quadrant, dry needling performed to iliacus and where it inserts distally. Significant twitch response here and in adductors today. Instrument assisted soft tissue mobilization utilized to further decrease tension here with excellent response/release. Advanced unilateral strengthening provided in the form of modified pistol squat with UE support and lateral glider; these provided appropriate challenge, but he did initially need cues for improved core activation and Rt knee stability. Believe these exercises will help improve pelvic/knee stability that has Rt side chronically activated. He will continue to benefit from skilled PT intervention in order to decrease Rt sided abdominal/pelvic/low back pain and return to football/training activities without any pain.    OBJECTIVE IMPAIRMENTS: decreased coordination, decreased endurance, decreased strength, increased muscle spasms, postural dysfunction, and pain.    ACTIVITY LIMITATIONS:  sport   PARTICIPATION LIMITATIONS: community activity   PERSONAL FACTORS: 1-2 comorbidities: ACL repair and groin strain on Rt side  are also affecting patient's functional outcome.    REHAB POTENTIAL: Good   CLINICAL DECISION MAKING: Evolving/moderate complexity   EVALUATION COMPLEXITY: Moderate     GOALS: Goals reviewed with patient? Yes   SHORT TERM  GOALS: Target date: 08/28/22 - updated 09/18/22   Pt will be 5/5 hip adduction and external rotation bilaterally without pain for improved running related activities. Baseline: Goal status: IN PROGRESS   2.  Pt will be able to run for  practice without cutting and no increased pain due to improved hip stability Baseline:  Goal status: IN PROGRESS       LONG TERM GOALS: Target date: 10/23/22 - updated 09/18/22   Pt will be independent with advanced HEP to maintain improvements made throughout therapy  Baseline:  Goal status: IN PROGRESS   2.  Pt will report 90% reduction of pain after running due to improvements in posture, strength, and muscle length   Baseline:  Goal status: IN PROGRESS   3.  Pt will be able to do single leg activities with moderate to heavy resistance for at least 30 reps bil to withstand forces needed for football activities. Baseline:  Goal status: IN PROGRESS   4.  Pt will be able to a full practice of running/cutting drills with 1/10 max pain the next day and only lasting for one day at most Baseline:  Goal status: IN PROGRESS         PLAN:   PT FREQUENCY: 1x/week   PT DURATION: 12 weeks   PLANNED INTERVENTIONS: Therapeutic exercises, Therapeutic activity, Neuromuscular re-education, Balance training, Gait training, Patient/Family education, Self Care, Joint mobilization, Dry Needling, Electrical stimulation, Cryotherapy, Moist heat, Taping, Biofeedback, Manual therapy, and Re-evaluation   PLAN FOR NEXT SESSION: f/u on initial HEP; assess adductor, lumbar, obturator for muscle spasms and dry needling as needed; progress single leg strength with varying vector loading  Heather Roberts, PT, DPT03/12/244:17 PM

## 2022-10-02 ENCOUNTER — Ambulatory Visit: Payer: BC Managed Care – PPO

## 2022-10-02 DIAGNOSIS — R279 Unspecified lack of coordination: Secondary | ICD-10-CM

## 2022-10-02 DIAGNOSIS — M6281 Muscle weakness (generalized): Secondary | ICD-10-CM

## 2022-10-02 DIAGNOSIS — R293 Abnormal posture: Secondary | ICD-10-CM

## 2022-10-02 DIAGNOSIS — R252 Cramp and spasm: Secondary | ICD-10-CM | POA: Diagnosis not present

## 2022-10-02 NOTE — Therapy (Signed)
OUTPATIENT PHYSICAL THERAPY TREATMENT NOTE   Patient Name: Derrick Ellis MRN: [id number] DOB:06-06-2001, 22 y.o., male Today's Date: 10/02/2022  PCP: Cristine Polio, MD REFERRING PROVIDER: Cleophas Dunker, MD  END OF SESSION:   PT End of Session - 10/02/22 1532     Visit Number 6    Date for PT Re-Evaluation 10/23/22    Authorization Type BCBS    PT Start Time 1530    PT Stop Time 1603    PT Time Calculation (min) 33 min    Activity Tolerance Patient tolerated treatment well    Behavior During Therapy [facility] for tasks assessed/performed               History reviewed. No pertinent past medical history. History reviewed. No pertinent surgical history. There are no problems to display for this patient.   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 Last week was rough due to having multiple practices in a row with running and heavy squatting. He was sore after last session, but felt decrease in pain.    PAIN:  Are you having pain? Yes: NPRS scale: 2/10 Pain location: Rt lower quadrant/adductors/hip Pain description: achy/tight Aggravating factors: sprinting, running, jumping Relieving factors: rest  SUBJECTIVE STATEMENT: Pain is from 3 to 8-9/10 depending on what I have done before.  Pt states the pain stops me from running.  Lifting doesn't bother me. Playing football and starting spring off season this week. Have  run Saturday and was very sore, was sprinting and cutting.  Fluid intake: a lot water   PAIN:  Are you having pain? Yes NPRS scale: 8/10 Pain location:  bilateral groin and lower abdomen   Pain type: aching and sore Pain description: intermittent    Aggravating factors: running makes it worse Relieving factors: just not running until it is better   PRECAUTIONS: None   WEIGHT BEARING RESTRICTIONS: No   FALLS:  Has patient fallen in last 6 months? No   LIVING ENVIRONMENT: Lives with: lives with their family and but at school right now Lives in: House/apartment  OCCUPATION: student and playing sports   PLOF: Independent   PATIENT GOALS: be able to run without pain   PERTINENT HISTORY:  Rt ACL surgery, Rt adductor strain     BOWEL MOVEMENT: Pain with bowel movement: No Type of bowel movement:Type (Bristol Stool Scale) normal, Frequency every other day, and Strain No Fully empty rectum: Yes:     Fiber supplement: Yes: greens supplement   URINATION: Pain with urination: No Fully empty bladder: No Stream: Strong Urgency: No Frequency: normal Leakage:  Pads:    INTERCOURSE: Pain with intercourse:  No Climax: normal Ejaculation:      OBJECTIVE:    DIAGNOSTIC FINDINGS:      PATIENT SURVEYS:      PFIQ-7    COGNITION: Overall cognitive status: Within functional limits for tasks assessed                          SENSATION: Light touch:  Proprioception: Appears intact   MUSCLE LENGTH: Hamstrings: 75% Thomas test:    LUMBAR SPECIAL TESTS:  Straight leg raise test: Negative   FUNCTIONAL TESTS:  Dead lift - uses lumbar flexion when appx <110 deg hip flexion Single leg dead lift - lateral trunk shift on Rt LE; lumbar flexion bil Single leg dead lift with rotational resistance weight shift to the Rt and knee valgus   GAIT:   Comments: WFL    POSTURE:  scoliosis   PELVIC ALIGNMENT:   LUMBARAROM/PROM:   A/PROM A/PROM  eval  Flexion WFL    Extension    Right lateral flexion    Left lateral flexion    Right rotation    Left rotation     (Blank rows = not tested)   LOWER EXTREMITY AROM/PROM:   A/PROM Right eval Left eval  Hip flexion 5/5 5/5  Hip extension 4/5 4/5  Hip abduction 5/5 5/5  Hip adduction 4/5 pain 4/5 pain on Rt side  Hip internal rotation 5/5 5/5  Hip external rotation 4/5 pain 5/5  Knee flexion      Knee extension      Ankle dorsiflexion      Ankle plantarflexion      Ankle inversion      Ankle eversion       (Blank rows = not tested)   LOWER EXTREMITY MMT: Testing out of 0-5 strength scale MMT Right eval Left eval  Hip flexion 5 5  Hip extension      Hip abduction 4 4 with pain  Hip adduction 4 with pain 4 with pain in Rt groin  Hip internal rotation 5 5  Hip external rotation 4+ 5  Knee flexion      Knee extension      Ankle dorsiflexion      Ankle plantarflexion      Ankle inversion      Ankle eversion        PALPATION: GENERAL adductors tight bil and pain with                External Perineal Exam palpation externally - no tenderness               Internal Pelvic Floor not assessed due to no reports of symptoms Patient confirms identification and approves PT to assess internal pelvic floor and treatment No - not discussed today   PELVIC MMT:   MMT eval  Internal Anal Sphincter    External Anal Sphincter    Puborectalis  Diastasis Recti    (Blank rows = not tested)   TONE:     TODAY'S TREATMENT 10/02/22 Manual: Trigger Point Dry-Needling  Treatment instructions: Expect mild to moderate muscle soreness. S/S of pneumothorax if dry needled over a lung field, and to seek immediate medical attention should they occur. Patient verbalized understanding of these instructions and education.  Patient Consent Given: Yes Education handout provided: Yes Muscles treated: Rt iliacus, obliques, common iliopsoas insertion, adductors Electrical stimulation performed:  No Parameters: N/A Treatment response/outcome: twitch response/release Sof ttissue mobilization to Rt lower quadrant    TREATMENT 09/25/22 Manual: Trigger Point Dry-Needling  Treatment instructions: Expect mild to moderate muscle soreness. S/S of pneumothorax if dry needled over a lung field, and to seek immediate medical attention should they occur. Patient verbalized understanding of these instructions and education.  Patient Consent Given: Yes Education handout provided: Yes Muscles treated: Rt iliacus, obliques, common iliopsoas insertion, adductors Electrical stimulation performed: No Parameters: N/A Treatment response/outcome: twitch response/release Instrument assisted soft tissue mobilization to Rt lower quadrant Sof ttissue mobilization to Rt lower quadrant Neuromuscular re-education: Lateral gliders  Bear crawl walk Modified pistol squat with UE support   TREATMENT 09/18/22 Manual: Trigger Point Dry-Needling  Treatment instructions: Expect mild to moderate muscle soreness. S/S of pneumothorax if dry needled over a lung field, and to seek immediate medical attention should they occur. Patient verbalized understanding of these instructions and education.  Patient Consent Given: Yes Education handout provided: Yes Muscles treated: Rt iliacus, obliques, QL Electrical stimulation performed: No Parameters: N/A Treatment response/outcome: twitch response/release Soft tissue mobilization to Rt lower quadrant/lumbar paraspinals Neuromuscular re-education: Supine bil and unilateral weight drop 3-way kick with resistance for improved core activation Core facilitation during UE work outs      PATIENT EDUCATION:  Education details: see above Person educated: Patient Education method: Consulting civil engineer, Media planner, Corporate treasurer cues, and Verbal cues Education comprehension: verbalized understanding and returned demonstration   HOME EXERCISE PROGRAM:    ASSESSMENT:   CLINICAL  IMPRESSION: Pt states that he had a lot of soreness with return to practice last week, but due to bilateral nature of soreness and not reproduction of specific Rt sided pain, believe this is more likely due to increase in activity intensity/residual post-exercise soreness vs increase in his specific pain. Because of this, believe he is seeing progress with condition. We discussed trying to perform some of the specific deep stabilization exercises prior to heavy lifting. Believe that doing bulgarian split squats will be helpful, but even more so with unilateral weight hold. He will continue to benefit from skilled PT intervention in order to decrease Rt sided abdominal/pelvic/low back pain and return to football/training activities without any pain.    OBJECTIVE IMPAIRMENTS: decreased coordination, decreased endurance, decreased strength, increased muscle spasms, postural dysfunction, and pain.    ACTIVITY LIMITATIONS:  sport   PARTICIPATION LIMITATIONS: community activity   PERSONAL FACTORS: 1-2 comorbidities: ACL repair and groin strain on Rt side  are also affecting patient's functional outcome.    REHAB POTENTIAL: Good   CLINICAL DECISION MAKING: Evolving/moderate complexity   EVALUATION COMPLEXITY: Moderate     GOALS: Goals reviewed with patient? Yes   SHORT TERM GOALS: Target date: 08/28/22 - updated 09/18/22   Pt will be 5/5 hip adduction and external rotation bilaterally without pain for improved running related activities. Baseline: Goal status: IN PROGRESS   2.  Pt will be able to run for practice without cutting and no increased pain due to improved hip stability Baseline:  Goal status: IN PROGRESS       LONG TERM GOALS: Target date: 10/23/22 - updated 09/18/22   Pt will be independent with advanced HEP to maintain improvements made throughout therapy  Baseline:  Goal status: IN PROGRESS   2.  Pt will report 90% reduction of pain after running due to improvements in posture,  strength, and muscle length   Baseline:  Goal status: IN PROGRESS   3.  Pt will be able to do single leg activities with moderate to heavy resistance for at least 30 reps bil to withstand forces needed for football activities. Baseline:  Goal status: IN PROGRESS   4.  Pt will be able to a full practice of running/cutting drills with 1/10 max pain the next day and only lasting for one day at most Baseline:  Goal status: IN PROGRESS         PLAN:   PT FREQUENCY: 1x/week   PT DURATION: 12 weeks   PLANNED INTERVENTIONS: Therapeutic exercises, Therapeutic activity, Neuromuscular re-education, Balance training, Gait training, Patient/Family education, Self Care, Joint mobilization, Dry Needling, Electrical stimulation, Cryotherapy, Moist heat, Taping, Biofeedback, Manual therapy, and Re-evaluation   PLAN FOR NEXT SESSION: f/u on initial HEP; assess adductor, lumbar, obturator for muscle spasms and dry needling as needed; progress single leg strength with varying vector loading  Heather Roberts, PT, DPT03/19/244:15 PM

## 2022-10-08 NOTE — Progress Notes (Unsigned)
    Derrick Ellis D.[place] Phone: [phone]   Assessment and Plan:    1. Sports hernia, subsequent encounter 2. Somatic dysfunction of lumbar region 3. Somatic dysfunction of pelvic region 4. Somatic dysfunction of lower extremities  -Chronic with exacerbation, subsequent visit - Overall improvement with decrease symptoms after patient previously completed a 2-week course of meloxicam, has continued HEP including work with his athletic trainers at school, OMT at multiple office visits - Continue to recommend pelvic floor physical therapy, dry needling, regular chiropractor treatments at school - Patient has received significant relief with OMT in the past.  Elects for repeat OMT today.  Tolerated well per note below. - Decision today to treat with OMT was based on Physical Exam  After verbal consent patient was treated with HVLA (high velocity low amplitude), ME (muscle energy), FPR (flex positional release), ST (soft tissue), PC/PD (Pelvic Compression/ Pelvic Decompression) techniques in lower extremity, lumbar, and pelvic areas. Patient tolerated the procedure well with improvement in symptoms.  Patient educated on potential side effects of soreness and recommended to rest, hydrate, and use Tylenol as needed for pain control.    Pertinent previous records reviewed include none   Follow Up: 2 weeks for reevaluation.  Could consider repeat OMT   Subjective:   I, Derrick Ellis, am serving as a Education administrator for Doctor Derrick Ellis   Chief Complaint: pubic symphysis pain    HPI:    08/28/2022 Patient is a 22 year old male complaining of pubic symphysis pain . Patient states that he has pain in the front of his pubic area for about 5 months, football player increased playing time , wasn't able to run, wasn't able to get through warm up , wasn't able to play through season, soreness radiates up and down his legs up  to the ASIS, no MOI, hx of lower leg injuries , hx of pelvic floor stuff that he dealt with last spring, no meds for the pain , no numbness or tingling, pain when he runs and post work outs , but when he is at rest he is fine    09/11/2022 Patient states that his pain continues with running but not with strength training. Pain in pubic symphysis. Manipulation is helpful for his pain.    10/09/2022 Patient states that he is better     Relevant Historical Information: None pertinent  Additional pertinent review of systems negative.   Current Outpatient Medications:    meloxicam (MOBIC) 15 MG tablet, Take 1 tablet (15 mg total) by mouth daily., Disp: 30 tablet, Rfl: 0   Objective:     Vitals:   10/09/22 0944  BP: 110/78  Pulse: (!) 48  SpO2: 99%  Weight: 178 lb (80.7 kg)  Height: 5\' 9"  (1.753 m)      Body mass index is 26.29 kg/m.    Physical Exam:    General: Well-appearing, cooperative, sitting comfortably in no acute distress.   OMT Physical Exam:   ASIS Compression Test: Positive Right Lower extremity: TTP proximal adductor's bilaterally and superior pubic rami Lumbar: TTP paraspinal, L1-3 RLSR Pelvis: Right anterior innominate Sacrum: NTTP bilateral sacral base, negative sphinx  Electronically signed by:  Derrick Ellis D.[facility] 10:06 AM 10/09/22

## 2022-10-09 ENCOUNTER — Ambulatory Visit (INDEPENDENT_AMBULATORY_CARE_PROVIDER_SITE_OTHER): Payer: BC Managed Care – PPO | Admitting: Sports Medicine

## 2022-10-09 ENCOUNTER — Ambulatory Visit: Payer: BC Managed Care – PPO

## 2022-10-09 VITALS — BP 110/78 | HR 48 | Ht 69.0 in | Wt 178.0 lb

## 2022-10-09 DIAGNOSIS — R293 Abnormal posture: Secondary | ICD-10-CM

## 2022-10-09 DIAGNOSIS — R252 Cramp and spasm: Secondary | ICD-10-CM

## 2022-10-09 DIAGNOSIS — M9906 Segmental and somatic dysfunction of lower extremity: Secondary | ICD-10-CM

## 2022-10-09 DIAGNOSIS — M6281 Muscle weakness (generalized): Secondary | ICD-10-CM

## 2022-10-09 DIAGNOSIS — M9905 Segmental and somatic dysfunction of pelvic region: Secondary | ICD-10-CM

## 2022-10-09 DIAGNOSIS — M9903 Segmental and somatic dysfunction of lumbar region: Secondary | ICD-10-CM

## 2022-10-09 DIAGNOSIS — R279 Unspecified lack of coordination: Secondary | ICD-10-CM

## 2022-10-09 DIAGNOSIS — S3981XD Other specified injuries of abdomen, subsequent encounter: Secondary | ICD-10-CM | POA: Diagnosis not present

## 2022-10-09 NOTE — Patient Instructions (Signed)
Low back HEP  2 week follow up [facility]

## 2022-10-09 NOTE — Therapy (Signed)
OUTPATIENT PHYSICAL THERAPY TREATMENT NOTE   Patient Name: Derrick Ellis MRN: [id number] DOB:[birth year], 22 y.o., male Today's Date: 10/09/2022  PCP: Cristine Polio, MD REFERRING PROVIDER: Cleophas Dunker, MD  END OF SESSION:   PT End of Session - 10/09/22 1531     Visit Number 7    Date for PT Re-Evaluation 10/23/22    Authorization Type BCBS    PT Start Time 1530    PT Stop Time 1606    PT Time Calculation (min) 36 min    Activity Tolerance Patient tolerated treatment well    Behavior During Therapy [facility] for tasks assessed/performed               History reviewed. No pertinent past medical history. History reviewed. No pertinent surgical history. There are no problems to display for this patient.   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 Pt states that abdominal pain is better, but he is having a lot of tightness in bil hip flexors/quads and low back.    PAIN:  Are you having pain? Yes: NPRS scale: 2/10 Pain location: Rt lower quadrant/adductors/hip Pain description: achy/tight Aggravating factors: sprinting, running, jumping Relieving factors: rest  SUBJECTIVE STATEMENT: Pain is from 3 to 8-9/10 depending on what I have done before.  Pt states the pain stops me from running.  Lifting doesn't bother me. Playing football and starting spring off season this week. Have run Saturday and was very sore, was sprinting and  cutting.  Fluid intake: a lot water   PAIN:  Are you having pain? Yes NPRS scale: 8/10 Pain location:  bilateral groin and lower abdomen   Pain type: aching and sore Pain description: intermittent    Aggravating factors: running makes it worse Relieving factors: just not running until it is better   PRECAUTIONS: None   WEIGHT BEARING RESTRICTIONS: No   FALLS:  Has patient fallen in last 6 months? No   LIVING ENVIRONMENT: Lives with: lives with their family and but at school right now Lives in: House/apartment     OCCUPATION: student and playing sports   PLOF: Independent  PATIENT GOALS: be able to run without pain   PERTINENT HISTORY:  Rt ACL surgery, Rt adductor strain     BOWEL MOVEMENT: Pain with bowel movement: No Type of bowel movement:Type (Bristol Stool Scale) normal, Frequency every other day, and Strain No Fully empty rectum: Yes:     Fiber supplement: Yes: greens supplement   URINATION: Pain with urination: No Fully empty bladder: No Stream: Strong Urgency: No Frequency: normal Leakage:  Pads:    INTERCOURSE: Pain with intercourse:  No Climax: normal Ejaculation:      OBJECTIVE:    DIAGNOSTIC FINDINGS:      PATIENT SURVEYS:      PFIQ-7    COGNITION: Overall cognitive status: Within functional limits for tasks assessed                          SENSATION: Light touch:  Proprioception: Appears intact   MUSCLE LENGTH: Hamstrings: 75% Thomas test:    LUMBAR SPECIAL TESTS:  Straight leg raise test: Negative   FUNCTIONAL TESTS:  Dead lift - uses lumbar flexion when appx <110 deg hip flexion Single leg dead lift - lateral trunk shift on Rt LE; lumbar flexion bil Single leg dead lift with rotational resistance weight shift to the Rt and knee valgus   GAIT:   Comments: WFL    POSTURE:  scoliosis   PELVIC ALIGNMENT:   LUMBARAROM/PROM:   A/PROM A/PROM  eval  Flexion WFL   Extension    Right lateral flexion    Left  lateral flexion    Right rotation    Left rotation     (Blank rows = not tested)   LOWER EXTREMITY AROM/PROM:   A/PROM Right eval Left eval  Hip flexion 5/5 5/5  Hip extension 4/5 4/5  Hip abduction 5/5 5/5  Hip adduction 4/5 pain 4/5 pain on Rt side  Hip internal rotation 5/5 5/5  Hip external rotation 4/5 pain 5/5  Knee flexion      Knee extension      Ankle dorsiflexion      Ankle plantarflexion      Ankle inversion      Ankle eversion       (Blank rows = not tested)   LOWER EXTREMITY MMT: Testing out of 0-5 strength scale MMT Right eval Left eval  Hip flexion 5 5  Hip extension      Hip abduction 4 4 with pain  Hip adduction 4 with pain 4 with pain in Rt groin  Hip internal rotation 5 5  Hip external rotation 4+ 5  Knee flexion      Knee extension      Ankle dorsiflexion      Ankle plantarflexion      Ankle inversion      Ankle eversion        PALPATION: GENERAL adductors tight bil and pain with                External Perineal Exam palpation externally - no tenderness               Internal Pelvic Floor not assessed due to no reports of symptoms Patient confirms identification and approves PT to assess internal pelvic floor and treatment No - not discussed today   PELVIC MMT:   MMT eval  Internal Anal Sphincter    External Anal Sphincter    Puborectalis    Diastasis Recti    (Blank rows = not  tested)   TONE:     TODAY'S TREATMENT 10/09/22 Manual: Trigger Point Dry-Needling  Treatment instructions: Expect mild to moderate muscle soreness. S/S of pneumothorax if dry needled over a lung field, and to seek immediate medical attention should they occur. Patient verbalized understanding of these instructions and education.  Patient Consent Given: Yes Education handout provided: Yes Muscles treated: L4-5 bil lumbar multifidi Electrical stimulation performed: No Parameters: N/A Treatment response/outcome: twitch response/release  Instrument  assisted soft tissue mobilization lumbar paraspinals and bil hip flexors   TREATMENT 10/02/22 Manual: Trigger Point Dry-Needling  Treatment instructions: Expect mild to moderate muscle soreness. S/S of pneumothorax if dry needled over a lung field, and to seek immediate medical attention should they occur. Patient verbalized understanding of these instructions and education.  Patient Consent Given: Yes Education handout provided: Yes Muscles treated: Rt iliacus, obliques, common iliopsoas insertion, adductors Electrical stimulation performed: No Parameters: N/A Treatment response/outcome: twitch response/release Sof ttissue mobilization to Rt lower quadrant    TREATMENT 09/25/22 Manual: Trigger Point Dry-Needling  Treatment instructions: Expect mild to moderate muscle soreness. S/S of pneumothorax if dry needled over a lung field, and to seek immediate medical attention should they occur. Patient verbalized understanding of these instructions and education.  Patient Consent Given: Yes Education handout provided: Yes Muscles treated: Rt iliacus, obliques, common iliopsoas insertion, adductors Electrical stimulation performed: No Parameters: N/A Treatment response/outcome: twitch response/release Instrument assisted soft tissue mobilization to Rt lower quadrant Sof ttissue mobilization to Rt lower quadrant Neuromuscular re-education: Lateral gliders  Bear crawl walk Modified pistol squat with UE support     PATIENT EDUCATION:  Education details: see above Person educated: Patient Education method: Consulting civil engineer, Demonstration, Tactile cues, and Verbal cues Education comprehension: verbalized understanding and returned demonstration   HOME EXERCISE PROGRAM:    ASSESSMENT:   CLINICAL IMPRESSION: Pt doing well with decreased Rt lower quadrant pain in the last week; he reports no pain during work outs. Good improvement in bil hip flexor restriction and tension in lumbar  paraspinals. Pt reported no increase in pain at end of session. He will continue to benefit from skilled PT intervention in order to decrease Rt sided abdominal/pelvic/low back pain and return to football/training activities without any pain.    OBJECTIVE IMPAIRMENTS: decreased coordination, decreased endurance, decreased strength, increased muscle spasms, postural dysfunction, and pain.    ACTIVITY LIMITATIONS:  sport   PARTICIPATION LIMITATIONS: community activity   PERSONAL FACTORS: 1-2 comorbidities: ACL repair and groin strain on Rt side  are also affecting patient's functional outcome.    REHAB POTENTIAL: Good   CLINICAL DECISION MAKING: Evolving/moderate complexity   EVALUATION COMPLEXITY: Moderate     GOALS: Goals reviewed with patient? Yes   SHORT TERM GOALS: Target date: 08/28/22 - updated 09/18/22   Pt will be 5/5 hip adduction and external rotation bilaterally without pain for improved running related activities. Baseline: Goal status: IN PROGRESS   2.  Pt will be able to run for practice without cutting and no increased pain due to improved hip stability Baseline:  Goal status: IN PROGRESS       LONG TERM GOALS: Target date: 10/23/22 - updated 09/18/22   Pt will be independent with advanced HEP to maintain improvements made throughout therapy  Baseline:  Goal status: IN PROGRESS   2.  Pt will report 90% reduction of pain after running due to improvements in posture, strength, and muscle length   Baseline:  Goal status: IN PROGRESS   3.  Pt will  be able to do single leg activities with moderate to heavy resistance for at least 30 reps bil to withstand forces needed for football activities. Baseline:  Goal status: IN PROGRESS   4.  Pt will be able to a full practice of running/cutting drills with 1/10 max pain the next day and only lasting for one day at most Baseline:  Goal status: IN PROGRESS         PLAN:   PT FREQUENCY: 1x/week   PT DURATION: 12  weeks   PLANNED INTERVENTIONS: Therapeutic exercises, Therapeutic activity, Neuromuscular re-education, Balance training, Gait training, Patient/Family education, Self Care, Joint mobilization, Dry Needling, Electrical stimulation, Cryotherapy, Moist heat, Taping, Biofeedback, Manual therapy, and Re-evaluation   PLAN FOR NEXT SESSION: f/u on initial HEP; assess adductor, lumbar, obturator for muscle spasms and dry needling as needed; progress single leg strength with varying vector loading  Heather Roberts, PT, DPT03/26/244:13 PM

## 2022-10-16 ENCOUNTER — Ambulatory Visit: Payer: BC Managed Care – PPO | Attending: Family Medicine

## 2022-10-16 DIAGNOSIS — R252 Cramp and spasm: Secondary | ICD-10-CM | POA: Diagnosis present

## 2022-10-16 DIAGNOSIS — R279 Unspecified lack of coordination: Secondary | ICD-10-CM | POA: Insufficient documentation

## 2022-10-16 DIAGNOSIS — R293 Abnormal posture: Secondary | ICD-10-CM | POA: Insufficient documentation

## 2022-10-16 DIAGNOSIS — M62838 Other muscle spasm: Secondary | ICD-10-CM | POA: Diagnosis present

## 2022-10-16 DIAGNOSIS — M6281 Muscle weakness (generalized): Secondary | ICD-10-CM | POA: Insufficient documentation

## 2022-10-16 NOTE — Therapy (Signed)
OUTPATIENT PHYSICAL THERAPY TREATMENT NOTE   Patient Name: Derrick Ellis MRN: [id number] DOB:2001/05/09, 22 y.o., male Today's Date: 10/16/2022  PCP: Cristine Polio, MD REFERRING PROVIDER: Cleophas Dunker, MD  END OF SESSION:   PT End of Session - 10/16/22 1531     Visit Number 8    Date for PT Re-Evaluation 10/23/22    Authorization Type BCBS    PT Start Time 1530    PT Stop Time 1609    PT Time Calculation (min) 39 min    Activity Tolerance Patient tolerated treatment well    Behavior During Therapy [facility] for tasks assessed/performed                History reviewed. No pertinent past medical history. History reviewed. No pertinent surgical history. There are no problems to display for this patient.   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 Some soreness after scraping last session that did not bother him during practice.    PAIN:  Are you having pain? Yes: NPRS scale: 2/10 Pain location: Rt lower quadrant/adductors/hip Pain description: achy/tight Aggravating factors: sprinting, running, jumping Relieving factors: rest  SUBJECTIVE STATEMENT: Pain is from 3 to 8-9/10 depending on what I have done before.  Pt states the pain stops me from running.  Lifting doesn't bother me. Playing football and starting spring off season this week. Have run Saturday and was very sore, was sprinting and  cutting.  Fluid intake: a lot water   PAIN:  Are you having pain? Yes NPRS scale: 8/10 Pain location:  bilateral groin and lower abdomen   Pain type: aching and sore Pain description: intermittent    Aggravating factors: running makes it worse Relieving factors: just not running until it is better   PRECAUTIONS: None   WEIGHT BEARING RESTRICTIONS: No   FALLS:  Has patient fallen in last 6 months? No   LIVING ENVIRONMENT: Lives with: lives with their family and but at school right now Lives in: House/apartment     OCCUPATION: student and playing sports   PLOF: Independent  PATIENT GOALS: be able to run without pain   PERTINENT HISTORY:  Rt ACL surgery, Rt adductor strain     BOWEL MOVEMENT: Pain with bowel movement: No Type of bowel movement:Type (Bristol Stool Scale) normal, Frequency every other day, and Strain No Fully empty rectum: Yes:     Fiber supplement: Yes: greens supplement   URINATION: Pain with urination: No Fully empty bladder: No Stream: Strong Urgency: No Frequency: normal Leakage:  Pads:    INTERCOURSE: Pain with intercourse:  No Climax: normal Ejaculation:      OBJECTIVE:    DIAGNOSTIC FINDINGS:      PATIENT SURVEYS:      PFIQ-7    COGNITION: Overall cognitive status: Within functional limits for tasks assessed                          SENSATION: Light touch:  Proprioception: Appears intact   MUSCLE LENGTH: Hamstrings: 75% Thomas test:    LUMBAR SPECIAL TESTS:  Straight leg raise test: Negative   FUNCTIONAL TESTS:  Dead lift - uses lumbar flexion when appx <110 deg hip flexion Single leg dead lift - lateral trunk shift on Rt LE; lumbar flexion bil Single leg dead lift with rotational resistance weight shift to the Rt and knee valgus   GAIT:   Comments: WFL    POSTURE:  scoliosis   PELVIC ALIGNMENT:   LUMBARAROM/PROM:   A/PROM A/PROM  eval  Flexion WFL   Extension    Right lateral flexion    Left  lateral flexion    Right rotation    Left rotation     (Blank rows = not tested)   LOWER EXTREMITY AROM/PROM:   A/PROM Right eval Left eval  Hip flexion 5/5 5/5  Hip extension 4/5 4/5  Hip abduction 5/5 5/5  Hip adduction 4/5 pain 4/5 pain on Rt side  Hip internal rotation 5/5 5/5  Hip external rotation 4/5 pain 5/5  Knee flexion      Knee extension      Ankle dorsiflexion      Ankle plantarflexion      Ankle inversion      Ankle eversion       (Blank rows = not tested)   LOWER EXTREMITY MMT: Testing out of 0-5 strength scale MMT Right eval Left eval  Hip flexion 5 5  Hip extension      Hip abduction 4 4 with pain  Hip adduction 4 with pain 4 with pain in Rt groin  Hip internal rotation 5 5  Hip external rotation 4+ 5  Knee flexion      Knee extension      Ankle dorsiflexion      Ankle plantarflexion      Ankle inversion      Ankle eversion        PALPATION: GENERAL adductors tight bil and pain with                External Perineal Exam palpation externally - no tenderness               Internal Pelvic Floor not assessed due to no reports of symptoms Patient confirms identification and approves PT to assess internal pelvic floor and treatment No - not discussed today   PELVIC MMT:   MMT eval  Internal Anal Sphincter    External Anal Sphincter    Puborectalis    Diastasis Recti    (Blank rows = not  tested)   TONE:     TODAY'S TREATMENT 10/16/22 Manual: Trigger Point Dry-Needling  Treatment instructions: Expect mild to moderate muscle soreness. S/S of pneumothorax if dry needled over a lung field, and to seek immediate medical attention should they occur. Patient verbalized understanding of these instructions and education.  Patient Consent Given: Yes Education handout provided: Yes Muscles treated: Bil iliacus and abdominals; bil  glutes  Electrical stimulation performed: No Parameters: N/A Treatment response/outcome: twitch  response/release  Instrument assisted soft tissue mobilization lumbar paraspinals and bil hip flexors Negative pressure soft tissue mobilization to hip flexors and bil abdominals    TREATMENT 10/09/22 Manual: Trigger Point Dry-Needling  Treatment instructions: Expect mild to moderate muscle soreness. S/S of pneumothorax if dry needled over a lung field, and to seek immediate medical attention should they occur. Patient verbalized understanding of these instructions and education.  Patient Consent Given: Yes Education handout provided: Yes Muscles treated: L4-5 bil lumbar multifidi Electrical stimulation performed: No Parameters: N/A Treatment response/outcome: twitch response/release  Instrument assisted soft tissue mobilization lumbar paraspinals and bil hip flexors   TREATMENT 10/02/22 Manual: Trigger Point Dry-Needling  Treatment instructions: Expect mild to moderate muscle soreness. S/S of pneumothorax if dry needled over a lung field, and to seek immediate medical attention should they occur. Patient verbalized understanding of these instructions and education.  Patient Consent Given: Yes Education handout provided: Yes Muscles treated: Rt iliacus, obliques, common iliopsoas insertion, adductors Electrical stimulation performed: No Parameters: N/A Treatment response/outcome: twitch response/release Sof ttissue mobilization to Rt lower quadrant   PATIENT EDUCATION:  Education details: see above Person educated: Patient Education method: Consulting civil engineer, Demonstration, Tactile cues, and Verbal cues Education comprehension: verbalized understanding and returned demonstration   HOME EXERCISE PROGRAM:    ASSESSMENT:   CLINICAL IMPRESSION: Pt doing well with decreased pain levels over the last week. Significant trigger point in Rt obliques; good response to dry needling; significant twitch response in bil glutes as well, more notably on Rt. Instrument assisted and negative  pressure soft tissue mobilization performed to bil abdominals with good decrease in adhesion resistance. He will continue to benefit from skilled PT intervention in order to decrease Rt sided abdominal/pelvic/low back pain and return to football/training activities without any pain.    OBJECTIVE IMPAIRMENTS: decreased coordination, decreased endurance, decreased strength, increased muscle spasms, postural dysfunction, and pain.    ACTIVITY LIMITATIONS:  sport   PARTICIPATION LIMITATIONS: community activity   PERSONAL FACTORS: 1-2 comorbidities: ACL repair and groin strain on Rt side  are also affecting patient's functional outcome.    REHAB POTENTIAL: Good   CLINICAL DECISION MAKING: Evolving/moderate complexity   EVALUATION COMPLEXITY: Moderate     GOALS: Goals reviewed with patient? Yes   SHORT TERM GOALS: Target date: 08/28/22 - updated 09/18/22   Pt will be 5/5 hip adduction and external rotation bilaterally without pain for improved running related activities. Baseline: Goal status: IN PROGRESS   2.  Pt will be able to run for practice without cutting and no increased pain due to improved hip stability Baseline:  Goal status: IN PROGRESS       LONG TERM GOALS: Target date: 10/23/22 - updated 09/18/22   Pt will be independent with advanced HEP to maintain improvements made throughout therapy  Baseline:  Goal status: IN PROGRESS   2.  Pt will report 90% reduction of pain after running due to improvements in posture, strength, and muscle length   Baseline:  Goal status: IN PROGRESS   3.  Pt will be able to do single leg activities with moderate to heavy resistance for at least 30 reps bil to withstand forces needed for football activities. Baseline:  Goal status: IN PROGRESS   4.  Pt will be able to a full practice of running/cutting drills with 1/10 max pain the next day and only lasting for one day at most Baseline:  Goal status: IN PROGRESS         PLAN:   PT  FREQUENCY: 1x/week   PT DURATION: 12 weeks   PLANNED INTERVENTIONS: Therapeutic exercises, Therapeutic activity, Neuromuscular re-education, Balance training, Gait training, Patient/Family education, Self Care, Joint mobilization, Dry Needling, Electrical stimulation, Cryotherapy, Moist heat, Taping, Biofeedback, Manual therapy, and Re-evaluation   PLAN FOR NEXT SESSION: f/u on initial HEP; assess adductor, lumbar, obturator for muscle spasms and dry needling as needed; progress single leg strength with varying vector loading  Heather Roberts, PT, DPT04/02/244:12 PM
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
## 2022-10-23 ENCOUNTER — Ambulatory Visit (INDEPENDENT_AMBULATORY_CARE_PROVIDER_SITE_OTHER): Payer: BC Managed Care – PPO | Admitting: Sports Medicine

## 2022-10-23 VITALS — BP 116/80 | HR 73 | Ht 69.0 in | Wt 178.0 lb

## 2022-10-23 DIAGNOSIS — M9903 Segmental and somatic dysfunction of lumbar region: Secondary | ICD-10-CM

## 2022-10-23 DIAGNOSIS — S3981XD Other specified injuries of abdomen, subsequent encounter: Secondary | ICD-10-CM | POA: Diagnosis not present

## 2022-10-23 DIAGNOSIS — M9906 Segmental and somatic dysfunction of lower extremity: Secondary | ICD-10-CM | POA: Diagnosis not present

## 2022-10-23 DIAGNOSIS — M9905 Segmental and somatic dysfunction of pelvic region: Secondary | ICD-10-CM

## 2022-10-23 NOTE — Patient Instructions (Signed)
Good to see you   

## 2022-10-30 ENCOUNTER — Ambulatory Visit: Payer: BC Managed Care – PPO

## 2022-10-30 DIAGNOSIS — M62838 Other muscle spasm: Secondary | ICD-10-CM

## 2022-10-30 DIAGNOSIS — R252 Cramp and spasm: Secondary | ICD-10-CM

## 2022-10-30 DIAGNOSIS — R279 Unspecified lack of coordination: Secondary | ICD-10-CM

## 2022-10-30 DIAGNOSIS — R293 Abnormal posture: Secondary | ICD-10-CM

## 2022-10-30 DIAGNOSIS — M6281 Muscle weakness (generalized): Secondary | ICD-10-CM
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
## 2022-11-06 ENCOUNTER — Ambulatory Visit: Payer: BC Managed Care – PPO

## 2022-11-06 DIAGNOSIS — R279 Unspecified lack of coordination: Secondary | ICD-10-CM

## 2022-11-06 DIAGNOSIS — R293 Abnormal posture: Secondary | ICD-10-CM

## 2022-11-06 DIAGNOSIS — M62838 Other muscle spasm: Secondary | ICD-10-CM

## 2022-11-06 DIAGNOSIS — M6281 Muscle weakness (generalized): Secondary | ICD-10-CM

## 2022-11-06 NOTE — Therapy (Signed)
OUTPATIENT PHYSICAL THERAPY TREATMENT NOTE   Patient Name: Derrick Ellis MRN: [id number] DOB:2000/09/04, 22 y.o., male Today's Date: 11/06/2022  PCP: Marcelle Smiling, MD REFERRING PROVIDER: Unknown Jim, MD  END OF SESSION:   PT End of Session - 11/06/22 0843     Visit Number 10    Date for PT Re-Evaluation 10/23/22    Authorization Type [employer]    PT Start Time 0845    PT Stop Time 0914    PT Time Calculation (min) 29 min    Activity Tolerance Patient tolerated treatment well    Behavior During Therapy [facility] for tasks assessed/performed                History reviewed. No pertinent past medical history. History reviewed. No pertinent surgical history. There are no problems to display for this patient.   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 He did run around over the weekend and states that he felt fine.    PAIN:  Are you having pain? Yes: NPRS scale: 0/10 Pain location: Rt lower quadrant/adductors/hip Pain description: achy/tight Aggravating factors: sprinting, running, jumping Relieving factors: rest  SUBJECTIVE STATEMENT: Pain is from 3 to 8-9/10 depending on what I have done before.  Pt states the pain stops me from running.  Lifting doesn't bother me. Playing football and starting spring off season this week. Have run Saturday and was very sore, was sprinting and cutting.  Fluid intake: a lot water    PAIN:  Are you having pain? Yes NPRS scale: 8/10 Pain location:  bilateral groin and lower abdomen   Pain type: aching and sore Pain description: intermittent    Aggravating factors: running makes it worse Relieving factors: just not running until it is better   PRECAUTIONS: None   WEIGHT BEARING RESTRICTIONS: No   FALLS:  Has patient fallen in last 6 months? No   LIVING ENVIRONMENT: Lives with: lives with their family and but at school right now Lives in: House/apartment     OCCUPATION: student and playing sports   PLOF: Independent   PATIENT GOALS: be able  to run without pain   PERTINENT HISTORY:  Rt ACL surgery, Rt adductor strain     BOWEL MOVEMENT: Pain with bowel movement: No Type of bowel movement:Type (Bristol Stool Scale) normal, Frequency every other day, and Strain No Fully empty rectum: Yes:     Fiber supplement: Yes: greens supplement   URINATION: Pain with urination: No Fully empty bladder: No Stream: Strong Urgency: No Frequency: normal Leakage:  Pads:    INTERCOURSE: Pain with intercourse:  No Climax: normal Ejaculation:      OBJECTIVE:    DIAGNOSTIC FINDINGS:      PATIENT SURVEYS:      PFIQ-7    COGNITION: Overall cognitive status: Within functional limits for tasks assessed                          SENSATION: Light touch:  Proprioception: Appears intact   MUSCLE LENGTH: Hamstrings: 75% Thomas test:    LUMBAR SPECIAL TESTS:  Straight leg raise test: Negative   FUNCTIONAL TESTS:  Dead lift - uses lumbar flexion when appx <110 deg hip flexion Single leg dead lift - lateral trunk shift on Rt LE; lumbar flexion bil Single leg dead lift with rotational resistance weight shift to the Rt and knee valgus   GAIT:   Comments: WFL    POSTURE:  scoliosis   PELVIC ALIGNMENT:   LUMBARAROM/PROM:   A/PROM A/PROM  eval  Flexion WFL   Extension    Right lateral flexion    Left lateral flexion    Right rotation     Left rotation     (Blank rows = not tested)   LOWER EXTREMITY AROM/PROM:   A/PROM Right eval Left eval  Hip flexion 5/5 5/5  Hip extension 4/5 4/5  Hip abduction 5/5 5/5  Hip adduction 4/5 pain 4/5 pain on Rt side  Hip internal rotation 5/5 5/5  Hip external rotation 4/5 pain 5/5  Knee flexion      Knee extension      Ankle dorsiflexion      Ankle plantarflexion      Ankle inversion      Ankle eversion       (Blank rows = not tested)   LOWER EXTREMITY MMT: Testing out of 0-5 strength scale MMT Right eval Left eval  Hip flexion 5 5  Hip extension      Hip abduction 4 4 with pain  Hip adduction 4 with pain 4 with pain in Rt groin  Hip internal rotation 5 5  Hip external rotation 4+ 5  Knee flexion      Knee extension      Ankle dorsiflexion      Ankle plantarflexion      Ankle inversion      Ankle eversion        PALPATION: GENERAL adductors tight bil and pain with                External Perineal Exam palpation externally - no tenderness               Internal Pelvic Floor not assessed due to no reports of symptoms Patient confirms identification and approves PT to assess internal pelvic floor and treatment No - not discussed today   PELVIC MMT:   MMT eval  Internal Anal Sphincter    External Anal Sphincter    Puborectalis    Diastasis Recti    (Blank rows = not tested)   TONE:  TODAY'S TREATMENT 11/06/22 Manual: Trigger Point Dry-Needling  Treatment instructions: Expect mild to moderate muscle soreness. S/S of pneumothorax if dry needled over a lung field, and to seek immediate medical attention should they occur. Patient verbalized understanding of these instructions and education.  Patient Consent Given: Yes Education handout provided: Yes Muscles treated: Bil glutes  Electrical stimulation performed: No Parameters: N/A Treatment response/outcome: twitch response/release  Instrument assisted soft tissue mobilization lumbar paraspinals and  bil hip flexors Soft tissue mobilization to bil glutes/lumbar paraspinals and hip flexors  TREATMENT 10/30/22 Manual: Trigger Point Dry-Needling  Treatment instructions: Expect mild to moderate muscle soreness. S/S of pneumothorax if dry needled over a lung field, and to seek immediate medical attention should they occur. Patient verbalized understanding of these instructions and education.  Patient Consent Given: Yes Education handout provided: Yes Muscles treated: Bil glutes  Electrical stimulation performed: No Parameters: N/A Treatment response/outcome: twitch response/release  Instrument assisted soft tissue mobilization lumbar paraspinals and bil hip flexors Soft tissue mobilization to bil glutes/lumbar paraspinals Neuromuscular re-education: Transversus abdominus training with multimodal cues for improved motor control and breath coordination RUSI used for augmented core facilitation training and improved activation patterns    TREATMENT 10/16/22 Manual: Trigger Point Dry-Needling  Treatment instructions: Expect mild to moderate muscle soreness. S/S of pneumothorax if dry needled over a lung field, and to seek immediate medical attention should they occur. Patient verbalized understanding of these instructions and education.  Patient Consent Given: Yes Education handout provided: Yes Muscles treated: Bil iliacus and abdominals; bil  glutes  Electrical stimulation performed: No Parameters: N/A Treatment response/outcome: twitch response/release  Instrument assisted soft tissue mobilization lumbar paraspinals and bil hip flexors Negative pressure soft tissue mobilization to hip flexors and bil abdominals   PATIENT EDUCATION:  Education details: see above Person educated: Patient Education method: Programmer, multimedia, Demonstration, Tactile cues, and Verbal cues Education comprehension: verbalized understanding and returned demonstration   HOME EXERCISE PROGRAM:     ASSESSMENT:   CLINICAL IMPRESSION: Pt overall doing very well with significant improvement in pain, ability to perform high level athletic activities without aggravation of pain, and having met all goals. DN and manual techniques performed for last time this session to get remaining tightness/trigger points out of glutes, hip flexors, and Rt obliques. He tolerated very well with only significant trigger points in Rt hip flexors and glutes. He was encouraged to call with any questions or concerns. He is prepared to D/C skilled PT intervention at this time.    OBJECTIVE IMPAIRMENTS: decreased coordination, decreased endurance, decreased strength, increased muscle spasms, postural dysfunction, and pain.    ACTIVITY LIMITATIONS:  sport   PARTICIPATION LIMITATIONS: community activity   PERSONAL FACTORS: 1-2 comorbidities: ACL repair and groin strain on Rt side  are also affecting patient's functional outcome.    REHAB POTENTIAL: Good   CLINICAL DECISION MAKING: Evolving/moderate complexity   EVALUATION COMPLEXITY: Moderate     GOALS: Goals reviewed with patient? Yes   SHORT TERM GOALS: Target date: 08/28/22 - updated 09/18/22   Pt will be 5/5 hip adduction and external rotation bilaterally without pain for improved running related activities. Baseline: Goal status: MET 11/06/22   2.  Pt will be able to run for practice without cutting and no increased pain due to improved hip stability Baseline:  Goal status: MET 11/06/22       LONG TERM GOALS: Target date: 10/23/22 - updated 09/18/22   Pt will be independent with advanced HEP to maintain improvements made throughout therapy  Baseline:  Goal status: MET 11/06/22   2.  Pt will report 90% reduction of pain after running due to improvements in posture, strength, and muscle length   Baseline:  Goal status: MET 11/06/22   3.  Pt will be able to do single leg activities with moderate to heavy resistance for at least 30 reps bil to withstand  forces needed for football activities. Baseline:  Goal status: MET 11/06/22   4.  Pt will be able to a full practice of running/cutting drills with 1/10 max pain the next day and only lasting for one day at most Baseline:  Goal status: MET 11/06/22         PLAN:   PT FREQUENCY:    PT DURATION:    PLANNED INTERVENTIONS:   PLAN FOR NEXT SESSION: DC  PHYSICAL THERAPY DISCHARGE SUMMARY  Visits from Start of Care: 10  Current functional level related to goals / functional outcomes: Independent   Remaining deficits: None   Education / Equipment: See above   Patient agrees to discharge. Patient goals were met. Patient is being discharged due to meeting the stated rehab goals.    Julio Alm, PT, DPT04/23/249:19 AM

## 2022-11-13 ENCOUNTER — Ambulatory Visit: Payer: BC Managed Care – PPO

## 2023-04-11 IMAGING — XA DG FLUORO GUIDE NDL PLC/BX
1 series · 1 of 1 positions shown · non-contrast
Comparison: Pelvic MRI 09/11/2021

CLINICAL DATA: Football player with pelvic pain. Degenerative
spurring and subchondral edema at the pubic symphysis on MRI.

EXAM:
PUBIC SYMPHYSIS INJECTION UNDER FLUOROSCOPY

[Series 1: ortho standard · 1 of 1 slices shown]
[im 1/1]
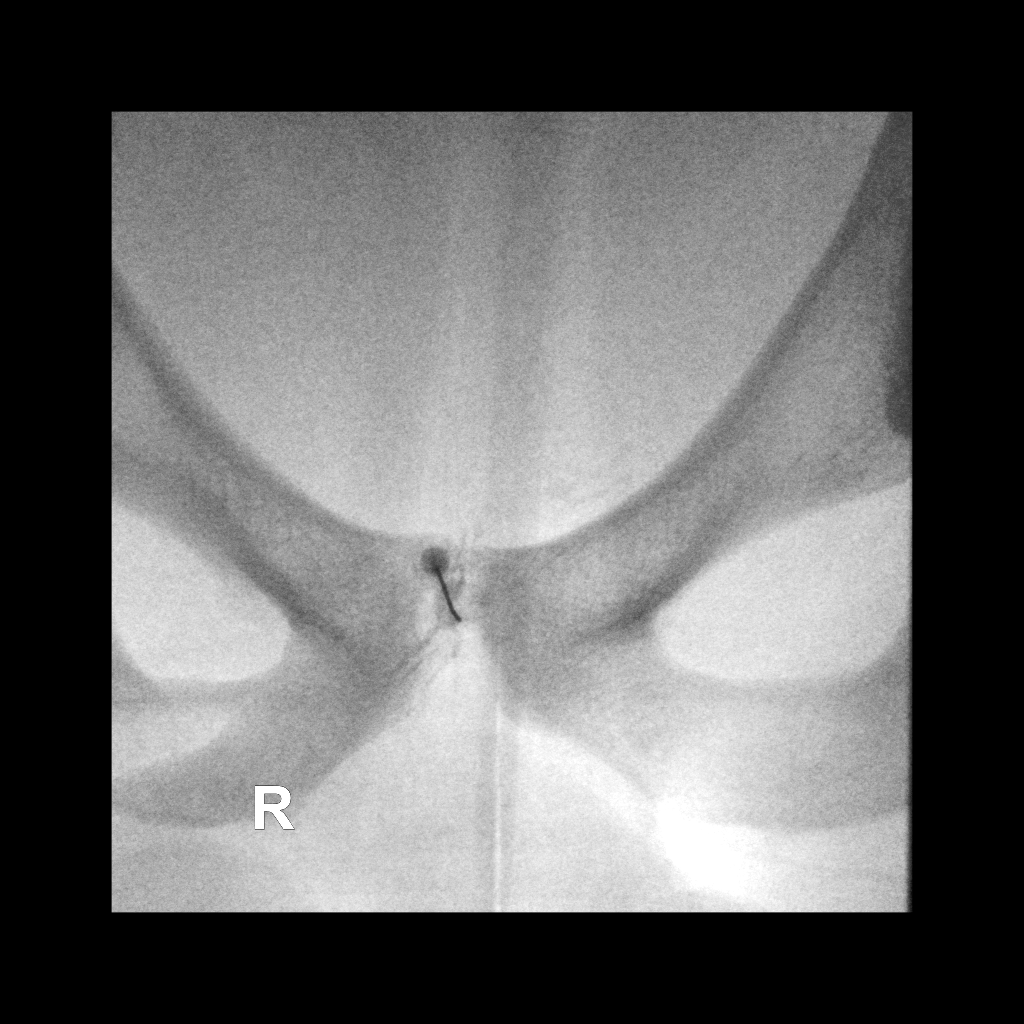

[1 of 1 positions shown; findings below may reference images not displayed]

FLUOROSCOPY:
Radiation Exposure Index (as provided by the fluoroscopic device):
2.30 mGy Kerma

PROCEDURE:
Overlying skin prepped with Betadine, draped in the usual sterile
fashion, and infiltrated locally with 1% lidocaine. A 2 inch 25
gauge needle was advanced into the central portion of the pubic
symphysis. Injection of a small amount of Isovue-M 300 confirmed
needle placement within the symphysis, and a small amount of
contrast extravasation was observed inferolaterally on the right
likely corresponding to a secondary cleft on MRI. A mixture of 40 mg
of Depo-Medrol and 0.5 mL of 0.5% bupivacaine was injected. There
was no immediate complication.
IMPRESSION: Technically successful pubic symphysis injection under fluoroscopy.
